# Patient Record
Sex: Female | Born: 1985 | Race: Asian | Hispanic: No | Smoking: Current some day smoker
Health system: Southern US, Community
[De-identification: ages and names within clinical notes are randomized; demographics above are authoritative.]

## PROBLEM LIST (undated history)

## (undated) DIAGNOSIS — I10 Essential (primary) hypertension: Secondary | ICD-10-CM

## (undated) DIAGNOSIS — F250 Schizoaffective disorder, bipolar type: Secondary | ICD-10-CM

## (undated) DIAGNOSIS — F259 Schizoaffective disorder, unspecified: Secondary | ICD-10-CM

## (undated) HISTORY — PX: NO PAST SURGERIES: SHX2092

---

## 2008-03-28 ENCOUNTER — Emergency Department (HOSPITAL_COMMUNITY): Admission: EM | Admit: 2008-03-28 | Discharge: 2008-03-28 | Payer: Self-pay | Admitting: Family Medicine

## 2009-02-17 ENCOUNTER — Emergency Department (HOSPITAL_COMMUNITY): Admission: EM | Admit: 2009-02-17 | Discharge: 2009-02-17 | Payer: Self-pay | Admitting: Family Medicine

## 2011-09-14 LAB — POCT URINALYSIS DIP (DEVICE)
Glucose, UA: NEGATIVE
Hgb urine dipstick: NEGATIVE
Nitrite: NEGATIVE
Operator id: 247071
Protein, ur: NEGATIVE
Specific Gravity, Urine: 1.015
Urobilinogen, UA: 0.2

## 2011-09-14 LAB — POCT INFECTIOUS MONO SCREEN: Mono Screen: POSITIVE — AB

## 2011-09-14 LAB — POCT PREGNANCY, URINE: Preg Test, Ur: NEGATIVE

## 2016-06-04 LAB — HM HEPATITIS C SCREENING LAB: HM Hepatitis Screen: NEGATIVE

## 2016-06-04 LAB — HM PAP SMEAR: HM PAP: NEGATIVE

## 2016-06-04 LAB — HM HIV SCREENING LAB: HM HIV SCREENING: NEGATIVE

## 2017-01-31 ENCOUNTER — Emergency Department
Admission: EM | Admit: 2017-01-31 | Discharge: 2017-01-31 | Disposition: A | Discharge status: Home / Self Care | Payer: No Typology Code available for payment source | Attending: Emergency Medicine | Admitting: Emergency Medicine

## 2017-01-31 ENCOUNTER — Emergency Department: Payer: No Typology Code available for payment source

## 2017-01-31 DIAGNOSIS — S199XXA Unspecified injury of neck, initial encounter: Secondary | ICD-10-CM | POA: Diagnosis present

## 2017-01-31 DIAGNOSIS — Y999 Unspecified external cause status: Secondary | ICD-10-CM | POA: Diagnosis not present

## 2017-01-31 DIAGNOSIS — M7918 Myalgia, other site: Secondary | ICD-10-CM

## 2017-01-31 DIAGNOSIS — Y92410 Unspecified street and highway as the place of occurrence of the external cause: Secondary | ICD-10-CM | POA: Insufficient documentation

## 2017-01-31 DIAGNOSIS — S161XXA Strain of muscle, fascia and tendon at neck level, initial encounter: Secondary | ICD-10-CM | POA: Insufficient documentation

## 2017-01-31 DIAGNOSIS — Y9389 Activity, other specified: Secondary | ICD-10-CM | POA: Diagnosis not present

## 2017-01-31 DIAGNOSIS — F17210 Nicotine dependence, cigarettes, uncomplicated: Secondary | ICD-10-CM | POA: Diagnosis not present

## 2017-01-31 DIAGNOSIS — I10 Essential (primary) hypertension: Secondary | ICD-10-CM | POA: Diagnosis not present

## 2017-01-31 HISTORY — DX: Essential (primary) hypertension: I10

## 2017-01-31 MED ORDER — CYCLOBENZAPRINE HCL 10 MG PO TABS: 10.0000 mg | ORAL_TABLET | Freq: Three times a day (TID) | ORAL | 0 refills | Status: DC | PRN

## 2017-01-31 MED ORDER — METHYLPREDNISOLONE 4 MG PO TBPK
ORAL_TABLET | ORAL | 0 refills | Status: DC
Start: 2017-01-31 — End: 2017-09-23

## 2017-01-31 MED ORDER — TRAMADOL HCL 50 MG PO TABS: 50.0000 mg | ORAL_TABLET | Freq: Four times a day (QID) | ORAL | 0 refills | Status: DC | PRN
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
## 2017-01-31 NOTE — Discharge Instructions (Signed)
advised combination of pain medication and muscle relaxers can cause drowsiness.
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
## 2017-08-18 ENCOUNTER — Encounter: Payer: Self-pay | Admitting: Emergency Medicine

## 2017-08-18 ENCOUNTER — Emergency Department
Admission: EM | Admit: 2017-08-18 | Discharge: 2017-08-18 | Disposition: A | Discharge status: Home / Self Care | Attending: Emergency Medicine | Admitting: Emergency Medicine

## 2017-08-18 DIAGNOSIS — R251 Tremor, unspecified: Secondary | ICD-10-CM | POA: Insufficient documentation

## 2017-08-18 DIAGNOSIS — R079 Chest pain, unspecified: Secondary | ICD-10-CM

## 2017-08-18 DIAGNOSIS — I10 Essential (primary) hypertension: Secondary | ICD-10-CM | POA: Insufficient documentation

## 2017-08-18 DIAGNOSIS — F419 Anxiety disorder, unspecified: Secondary | ICD-10-CM | POA: Insufficient documentation

## 2017-08-18 DIAGNOSIS — F410 Panic disorder [episodic paroxysmal anxiety] without agoraphobia: Secondary | ICD-10-CM

## 2017-08-18 DIAGNOSIS — F17210 Nicotine dependence, cigarettes, uncomplicated: Secondary | ICD-10-CM | POA: Insufficient documentation

## 2017-08-18 LAB — URINE DRUG SCREEN, QUALITATIVE (ARMC ONLY)
Amphetamines, Ur Screen: NOT DETECTED
BARBITURATES, UR SCREEN: NOT DETECTED
Benzodiazepine, Ur Scrn: NOT DETECTED
CANNABINOID 50 NG, UR ~~LOC~~: NOT DETECTED
Cocaine Metabolite,Ur ~~LOC~~: NOT DETECTED
MDMA (Ecstasy)Ur Screen: NOT DETECTED
Methadone Scn, Ur: NOT DETECTED
Opiate, Ur Screen: NOT DETECTED
Phencyclidine (PCP) Ur S: NOT DETECTED
TRICYCLIC, UR SCREEN: NOT DETECTED

## 2017-08-18 MED ORDER — LORAZEPAM 1 MG PO TABS
1.0000 mg | ORAL_TABLET | Freq: Once | ORAL | Status: AC
  Administered 2017-08-18: 1 mg via ORAL
  Filled 2017-08-18: qty 1
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
## 2017-08-18 NOTE — ED Notes (Signed)
PT VOLUNTARY PENDING PSYCH CONSULT. 

## 2017-08-18 NOTE — ED Notes (Signed)
Dr clapacs with pt now.  Pt calm and cooperative.

## 2017-08-18 NOTE — ED Provider Notes (Signed)
[facility] Emergency Department Provider Note  ____________________________________________  Time seen: Approximately 5:29 PM  I have reviewed the triage vital signs and the nursing notes.   HISTORY  Chief Complaint Dizziness    HPI Katherine Stephens is a 31 y.o. female with a history of hypertension, anxiety not currently on any medications, presenting with chest pain. The patient reports that over the past several days, she has had increased stress in her life. Today she was driving to fill out some paperwork which was stressing her out, and then she received a phone call, so she pulled over because she was developing a chest discomfort that felt like "my heart was filling up with air"and the sensation went upwards into her head. She then developed a shaky sensation and felt lightheaded. At this time, she is feeling slightly better but still uncomfortable and now she has a sensation of tightness in the left upper arm. She does occasionally smoke but denies any illicit drug use including cocaine use. She denies any SI, HI or hallucinations.   Past Medical History:  Diagnosis Date  . Hypertension     There are no active problems to display for this patient.   No past surgical history on file.  Current Outpatient Rx  . Order #: [id number] Class: Print  . Order #: [id number] Class: Print  . Order #: [id number] Class: Print    Allergies Amoxicillin; Penicillins; and Sudafed [pseudoephedrine hcl]  No family history on file.  Social History Social History  Substance Use Topics  . Smoking status: Current Some Day Smoker    Types: Cigarettes  . Smokeless tobacco: Never Used  . Alcohol use Yes     Comment: 2 x week    Review of Systems Constitutional: No fever/chills.Positive lightheadedness without syncope. Positive generalized shakiness. Eyes: No visual changes. ENT: No sore throat. No congestion or rhinorrhea. Cardiovascular: Positive chest pain. Denies  palpitations. Respiratory: Denies shortness of breath.  No cough. Gastrointestinal: No abdominal pain.  No nausea, no vomiting.  No diarrhea.  No constipation. Genitourinary: Negative for dysuria. Musculoskeletal: Negative for back pain. Skin: Negative for rash. Neurological: Negative for headaches. No focal numbness, tingling or weakness.  Psychiatric:Positive anxiety without SI, HI or hallucinations.    ____________________________________________   PHYSICAL EXAM:  VITAL SIGNS: ED Triage Vitals  Enc Vitals Group     BP 08/18/17 1701 (!) 162/103     Pulse Rate 08/18/17 1701 81     Resp 08/18/17 1701 18     Temp 08/18/17 1701 98.1 F (36.7 C)     Temp Source 08/18/17 1701 Oral     SpO2 08/18/17 1701 100 %     Weight 08/18/17 1702 145 lb (65.8 kg)     Height 08/18/17 1702 5\' 7"  (1.702 m)     Head Circumference --      Peak Flow --      Pain Score --      Pain Loc --      Pain Edu? --      Excl. in [place]? --     Constitutional: Alert and oriented. Answers questions appropriately. The patient is anxious on my examination but makes good eye contact and has good insight.  Eyes: Conjunctivae are normal.  EOMI. No scleral icterus. Head: Atraumatic. Nose: No congestion/rhinnorhea. Mouth/Throat: Mucous membranes are moist.  Neck: No stridor.  Supple.  No JVD. No meningismus. Cardiovascular: Normal rate, regular rhythm. No murmurs, rubs or gallops.  Respiratory: Normal respiratory effort.  No accessory muscle  use or retractions. Lungs CTAB.  No wheezes, rales or ronchi. Gastrointestinal: Soft, nontender and nondistended.  No guarding or rebound.  No peritoneal signs. Musculoskeletal: No LE edema. No ttp in the calves or palpable cords.  Negative Homan's sign. Neurologic:  A&Ox3.  Speech is clear.  Face and smile are symmetric.  EOMI.  Moves all extremities well. Skin:  Skin is warm, dry and intact. No rash noted. Psychiatric: The patient has an anxious mood and affect. She does  not have pressured speech. She denies SI, HI or hallucinations on my examination. ____________________________________________   LABS (all labs ordered are listed, but only abnormal results are displayed)  Labs Reviewed  URINE DRUG SCREEN, QUALITATIVE ([place])   ____________________________________________  EKG  ED ECG REPORT I, Eula Listen, the attending physician, personally viewed and interpreted this ECG.   Date: 08/18/2017  EKG Time: 1739  Rate: 69  Rhythm: normal sinus rhythm  Axis: normal  Intervals:none  ST&T Change: No STEMI; no evidence of Brugada syndrome, hypertrophy, or prolonged QT.  ____________________________________________  RADIOLOGY  No results found.  ____________________________________________   PROCEDURES  Procedure(s) performed: None  Procedures  Critical Care performed: No ____________________________________________   INITIAL IMPRESSION / ASSESSMENT AND PLAN / ED COURSE  Pertinent labs & imaging results that were available during my care of the patient were reviewed by me and considered in my medical decision making (see chart for details).  31 y.o. female with a history of anxiety not currently on medications, HTN, presenting with anxiety, chest pain and shakiness. Overall, the patient is hypertensive on arrival and we will recheck this. We'll do a screening EKG. It is likely that the patient's symptoms are most likely due to a panic attack in the setting of a stressful event. I have recommended that we run additional blood work and explained to the patient the differential, including checking blood counts, electrolytes, pregnancy, and a thyroid panel. At this time, she understands the workup that I am recommending but is deferring any studies other than an EKG. She demonstrates competence to make that decision. I have contacted psychiatry and TTS, and will treat the patient with Ativan to improve her  symptoms.  ----------------------------------------- 7:47 PM on 08/18/2017 -----------------------------------------  The patient's repeat blood pressure is 138/99, which is still slightly high for her age. I have encouraged her to follow up with her primary care physician for her hypertension. The patient has also been seen by Dr. Carlena Hurl, the psychiatrist on call, who agrees that she is safe for discharge home. Return precautions as well as follow-up instructions were discussed  ____________________________________________  FINAL CLINICAL IMPRESSION(S) / ED DIAGNOSES  Final diagnoses:  Essential hypertension  Anxiety  Chest pain, unspecified type         NEW MEDICATIONS STARTED DURING THIS VISIT:  New Prescriptions   No medications on file      Eula Listen, MD 08/18/17 1948

## 2017-08-18 NOTE — Consult Note (Signed)
[facility] Consult   Reason for Consult:  Consult for 31 year old woman who presented to the hospital with anxiety like symptoms Referring Physician:  Mariea Clonts Patient Identification: Katherine Stephens MRN:  [id number] Principal Diagnosis: Anxiety attack Diagnosis:   Patient Active Problem List   Diagnosis Date Noted  . Anxiety attack [F41.0] 08/18/2017    Total Time spent with patient: 1 hour  Subjective:   Katherine Stephens is a 31 y.o. female patient admitted with "it has been a stressful few days".  HPI:  Patient interviewed chart reviewed. This is a 31 year old woman who presented to the emergency room today saying that she had something like an anxiety attack. Her description of it is rather peculiar. She talks about feeling like there was air rushing into her heart. This happened all she was driving today. She had by her account just had a stressful phone conversation but she does not want to discuss the details of it. It lasted some short period of time and she came to the emergency room still feeling anxious and stressed. Additionally she says for the last few days she has been having an odd sense of smell. She says that she has smelled vinegar constantly. On top of that the last month has been stressful for her. She just recently moved to [place] from [place]. It sounds like she is going through a lot of transitions in her life. She denies being depressed. She denies suicidal thoughts. Denies any substance abuse. Not currently receiving any outpatient psychiatric treatment.  Social history: She is a little vague about this. Apparently she had been in graduate school recently and is currently not enrolled but is hoping to resume it sometime in the future. She says she is living by herself here in [place] where she grew up. She just moved back to the community recently and indicates that she is in the process of trying to buy a home.  Medical history: History of  hypertension no other ongoing known medical problems  Substance abuse history: Says that she drinks "on occasion" but was not drinking today and does not feel like it is ever been a problem. Absence abuse.    Past Psychiatric History: Patient admits that she has had psychiatric treatment in the past. She says that she last saw a psychiatrist many years ago. This was when she was living in [place]. She also admits that she has had 1 previous psychiatric hospitalization that occurred in [place] several years ago. She will not go into any specific details about the circumstances of it. Denies that she ever tried to kill her self. The only medicine she can ever recall being on his clonazepam.  Risk to Self: Suicidal Ideation: No Suicidal Intent: No Is patient at risk for suicide?: No Suicidal Plan?: No Access to Means: No What has been your use of drugs/alcohol within the last 12 months?: Pt denies drug use. Pt reports occasional alcohol consumption. Triggers for Past Attempts: None known Intentional Self Injurious Behavior: None Risk to Others: Homicidal Ideation: No Thoughts of Harm to Others: No Current Homicidal Intent: No Current Homicidal Plan: No Access to Homicidal Means: No History of harm to others?: No Assessment of Violence: None Noted Does patient have access to weapons?: No Criminal Charges Pending?: No Does patient have a court date: No Prior Inpatient Therapy: Prior Inpatient Therapy: No Prior Outpatient Therapy: Prior Outpatient Therapy: Yes Prior Therapy Dates: "In College" Prior Therapy Facilty/Provider(s): [facility] Counsel[facility] Reason for Treatment: Anxiety Does patient have an ACCT  team?: No Does patient have Intensive In-House Services?  : No Does patient have [facility] services? : No Does patient have P4CC services?: No  Past Medical History:  Past Medical History:  Diagnosis Date  . Hypertension    No past surgical history on file. Family History: No family  history on file. Family Psychiatric  History: Does not know of any Social History:  History  Alcohol Use  . Yes    Comment: 2 x week     History  Drug Use No    Social History   Social History  . Marital status: Divorced    Spouse name: N/A  . Number of children: N/A  . Years of education: N/A   Social History Main Topics  . Smoking status: Current Some Day Smoker    Types: Cigarettes  . Smokeless tobacco: Never Used  . Alcohol use Yes     Comment: 2 x week  . Drug use: No  . Sexual activity: Not Asked   Other Topics Concern  . None   Social History Narrative  . None   Additional Social History:    Allergies:   Allergies  Allergen Reactions  . Amoxicillin   . Penicillins   . Sudafed [Pseudoephedrine Hcl]     Labs:  Results for orders placed or performed during the hospital encounter of 08/18/17 (from the past 48 hour(s))  Urine Drug Screen, Qualitative ([place] only)     Status: None   Collection Time: 08/18/17  7:39 PM  Result Value Ref Range   Tricyclic, Ur Screen NONE DETECTED NONE DETECTED   Amphetamines, Ur Screen NONE DETECTED NONE DETECTED   MDMA (Ecstasy)Ur Screen NONE DETECTED NONE DETECTED   Cocaine Metabolite,Ur [place] NONE DETECTED NONE DETECTED   Opiate, Ur Screen NONE DETECTED NONE DETECTED   Phencyclidine (PCP) Ur S NONE DETECTED NONE DETECTED   Cannabinoid 50 Ng, Ur [place] NONE DETECTED NONE DETECTED   Barbiturates, Ur Screen NONE DETECTED NONE DETECTED   Benzodiazepine, Ur Scrn NONE DETECTED NONE DETECTED   Methadone Scn, Ur NONE DETECTED NONE DETECTED    Comment: (NOTE) 144  Tricyclics, urine               Cutoff 1000 ng/mL 200  Amphetamines, urine             Cutoff 1000 ng/mL 300  MDMA (Ecstasy), urine           Cutoff 500 ng/mL 400  Cocaine Metabolite, urine       Cutoff 300 ng/mL 500  Opiate, urine                   Cutoff 300 ng/mL 600  Phencyclidine (PCP), urine      Cutoff 25 ng/mL 700  Cannabinoid, urine              Cutoff 50  ng/mL 800  Barbiturates, urine             Cutoff 200 ng/mL 900  Benzodiazepine, urine           Cutoff 200 ng/mL 1000 Methadone, urine                Cutoff 300 ng/mL 1100 1200 The urine drug screen provides only a preliminary, unconfirmed 1300 analytical test result and should not be used for non-medical 1400 purposes. Clinical consideration and professional judgment should 1500 be applied to any positive drug screen result due to possible 1600 interfering substances. A more specific alternate chemical  method 1700 must be used in order to obtain a confirmed analytical result.  1800 Gas chromato graphy / mass spectrometry (GC/MS) is the preferred 1900 confirmatory method.     No current facility-administered medications for this encounter.    Current Outpatient Prescriptions  Medication Sig Dispense Refill  . cyclobenzaprine (FLEXERIL) 10 MG tablet Take 1 tablet (10 mg total) by mouth 3 (three) times daily as needed. 15 tablet 0  . methylPREDNISolone (MEDROL DOSEPAK) 4 MG TBPK tablet Take Tapered dose as directed 21 tablet 0  . traMADol (ULTRAM) 50 MG tablet Take 1 tablet (50 mg total) by mouth every 6 (six) hours as needed. 20 tablet 0    Musculoskeletal: Strength & Muscle Tone: within normal limits Gait & Station: normal Patient leans: N/A  Psychiatric Specialty Exam: Physical Exam  Nursing note and vitals reviewed. Constitutional: She appears well-developed and well-nourished.  HENT:  Head: Normocephalic and atraumatic.  Eyes: Pupils are equal, round, and reactive to light. Conjunctivae are normal.  Neck: Normal range of motion.  Cardiovascular: Regular rhythm and normal heart sounds.   Respiratory: Effort normal. No respiratory distress.  GI: Soft.  Musculoskeletal: Normal range of motion.  Neurological: She is alert.  Skin: Skin is warm and dry.  Psychiatric: Judgment normal. Her affect is blunt. Her speech is delayed. She is slowed. Thought content is not paranoid.  Cognition and memory are normal. She expresses no homicidal and no suicidal ideation.    Review of Systems  Constitutional: Negative.   HENT: Negative.   Eyes: Negative.   Respiratory: Negative.   Cardiovascular: Negative.   Gastrointestinal: Negative.   Musculoskeletal: Negative.   Skin: Negative.   Neurological: Positive for sensory change.  Psychiatric/Behavioral: Positive for hallucinations. Negative for depression, memory loss, substance abuse and suicidal ideas. The patient is not nervous/anxious and does not have insomnia.     Blood pressure (!) 138/99, pulse 81, temperature 98.1 F (36.7 C), temperature source Oral, resp. rate 18, height 5\' 7"  (1.702 m), weight 145 lb (65.8 kg), last menstrual period 08/04/2017, SpO2 100 %.Body mass index is 22.71 kg/m.  General Appearance: Casual  Eye Contact:  Fair  Speech:  Slow  Volume:  Decreased  Mood:  Anxious  Affect:  Blunt and Patient has a rather peculiar flat blank like affect and looks distracted much of the time  Thought Process:  Descriptions of Associations: Tangential  Orientation:  Full (Time, Place, and Person)  Thought Content:  Hallucinations: Olfactory and Patient's thought process is peculiar. She will often stray off topic. She uses odd language and odd metaphors to describe things. It is certainly possible to follow what she is talking about that at times she certainly sounds like she is getting a little disorganized or lost in her own thoughts.  Suicidal Thoughts:  No  Homicidal Thoughts:  No  Memory:  Immediate;   Good Recent;   Fair Remote;   Fair  Judgement:  Fair  Insight:  Fair  Psychomotor Activity:  Normal  Concentration:  Concentration: Fair  Recall:  [place] of Knowledge:  Poor  Language:  Fair  Akathisia:  No  Handed:  Right  AIMS (if indicated):     Assets:  Desire for Improvement Financial Resources/Insurance Housing Physical Health Resilience  ADL's:  Intact  Cognition:  WNL  Sleep:         Treatment Plan Summary: Plan 31 year old woman who came in with symptoms that sound like they are best described as an anxiety attack.  Her symptoms have resolved at this point. There is no sign of dangerousness. No need for hospitalization. Patient inquires about the possibility of being given a prescription of clonazepam. I told her that we were not able to start a controlled substance from the emergency room but strongly encouraged her to get follow-up treatment in the community either from her primary care doctor or we will give her information about local mental health resources. Case reviewed with emergency room physician and TTS. The ER doctor and I both agree that the patient certainly has something peculiar about her that might suggest a schizotypal sort of presentation but there is no evidence that she is frankly psychotic. No other medications prescribed.  Disposition: No evidence of imminent risk to self or others at present.   Patient does not meet criteria for psychiatric inpatient admission. Supportive therapy provided about ongoing stressors.  Alethia Berthold, MD 08/18/2017 9:05 PM

## 2017-08-18 NOTE — Discharge Instructions (Signed)
Please return to the emergency department if you develop severe pain, lightheadedness or fainting, thoughts of hurting herself or anyone else, panic attacks that are not controllable, or any other symptoms concerning to you.

## 2017-08-18 NOTE — BH Assessment (Signed)
Assessment Note  Katherine Stephens is an 31 y.o. female presenting voluntarily for assessment with c/o panic attack. Pt denies SI and h/o suicide attempt. Pt denies HI and thoughts of harm towards others. Pt denies h/o violence/aggression. Pt denies hallucinations however, mentions experiencing an "invisible nose bleed" pta and smelling blood and vinegar. Pt reports multiple ongoing stressors such as adjusting to recent relocation and multiple school related stressors.   Past Medical History:  Past Medical History:  Diagnosis Date  . Hypertension     No past surgical history on file.  Family History: No family history on file.  Social History:  reports that she has been smoking Cigarettes.  She has never used smokeless tobacco. She reports that she drinks alcohol. She reports that she does not use drugs.  Additional Social History:  Alcohol / Drug Use Pain Medications: Pt denies abuse. Prescriptions: Pt denies abuse. Over the Counter: Pt denies abuse. History of alcohol / drug use?: No history of alcohol / drug abuse  CIWA: CIWA-Ar BP: (!) 162/103 Pulse Rate: 81 COWS:    Allergies:  Allergies  Allergen Reactions  . Amoxicillin   . Penicillins   . Sudafed [Pseudoephedrine Hcl]     Home Medications:  (Not in a hospital admission)  OB/GYN Status:  Patient's last menstrual period was 08/04/2017.  General Assessment Data Location of Assessment: [facility] Inpatient ED TTS Assessment: In system Is this a Tele or Face-to-Face Assessment?: Face-to-Face Is this an Initial Assessment or a Re-assessment for this encounter?: Initial Assessment Marital status: Divorced [place] name: Coralyn Mark Is patient pregnant?: Unknown Pregnancy Status: Unknown Living Arrangements: Alone Can pt return to current living arrangement?: Yes Admission Status: Voluntary Is patient capable of signing voluntary admission?: Yes Referral Source: Self/Family/Friend Insurance type: [employer]  Arrangements: Alone Name of Psychiatrist: None Name of Therapist: None  Education Status Is patient currently in school?: Yes Highest grade of school patient has completed: College  Risk to self with the past 6 months Suicidal Ideation: No Has patient been a risk to self within the past 6 months prior to admission? : No Suicidal Intent: No Has patient had any suicidal intent within the past 6 months prior to admission? : No Is patient at risk for suicide?: No Suicidal Plan?: No Has patient had any suicidal plan within the past 6 months prior to admission? : No Access to Means: No What has been your use of drugs/alcohol within the last 12 months?: Pt denies drug use. Pt reports occasional alcohol consumption. Previous Attempts/Gestures: No Triggers for Past Attempts: None known Intentional Self Injurious Behavior: None Family Suicide History: No Recent stressful life event(s): Other (Comment) (school related stress, multiple adjustments -relocation) Persecutory voices/beliefs?: No Depression: No Substance abuse history and/or treatment for substance abuse?: No Suicide prevention information given to non-admitted patients: Yes  Risk to Others within the past 6 months Homicidal Ideation: No Does patient have any lifetime risk of violence toward others beyond the six months prior to admission? : No Thoughts of Harm to Others: No Current Homicidal Intent: No Current Homicidal Plan: No Access to Homicidal Means: No History of harm to others?: No Assessment of Violence: None Noted Does patient have access to weapons?: No Criminal Charges Pending?: No Does patient have a court date: No Is patient on probation?: No  Psychosis Hallucinations: Olfactory (blood & vinegar) Delusions: None noted  Mental Status Report Appearance/Hygiene: Unremarkable Eye Contact: Fair Motor Activity: Unremarkable Speech: Logical/coherent Level of Consciousness: Alert  Mood: Anxious Affect:  Anxious Anxiety Level: Moderate Thought Processes: Circumstantial Judgement: Unimpaired Orientation: Person, Place, Time, Situation Obsessive Compulsive Thoughts/Behaviors: None  Cognitive Functioning Concentration: Decreased Memory: Recent Intact, Remote Intact IQ: Average Insight: Fair Impulse Control: Good Appetite: Good Weight Loss: 0 Weight Gain: 10 Sleep: No Change Total Hours of Sleep: 6 Vegetative Symptoms: None  ADLScreening [facility] Assessment Services) Patient's cognitive ability adequate to safely complete daily activities?: Yes Patient able to express need for assistance with ADLs?: Yes Independently performs ADLs?: Yes (appropriate for developmental age)  Prior Inpatient Therapy Prior Inpatient Therapy: No  Prior Outpatient Therapy Prior Outpatient Therapy: Yes Prior Therapy Dates: "In College" Prior Therapy Facilty/Provider(s): School Counselor Reason for Treatment: Anxiety Does patient have an ACCT team?: No Does patient have Intensive In-House Services?  : No Does patient have [facility] services? : No Does patient have P4CC services?: No  ADL Screening (condition at time of admission) Patient's cognitive ability adequate to safely complete daily activities?: Yes Is the patient deaf or have difficulty hearing?: No Does the patient have difficulty seeing, even when wearing glasses/contacts?: No Does the patient have difficulty concentrating, remembering, or making decisions?: Yes Patient able to express need for assistance with ADLs?: Yes Does the patient have difficulty dressing or bathing?: No Independently performs ADLs?: Yes (appropriate for developmental age) Does the patient have difficulty walking or climbing stairs?: No Weakness of Legs: None Weakness of Arms/Hands: None  Home Assistive Devices/Equipment Home Assistive Devices/Equipment: None  Therapy Consults (therapy consults require a physician order) PT Evaluation Needed: No OT Evalulation  Needed: No SLP Evaluation Needed: No Abuse/Neglect Assessment (Assessment to be complete while patient is alone) Physical Abuse: Denies Verbal Abuse: Denies Sexual Abuse: Denies Exploitation of patient/patient's resources: Denies Self-Neglect: Denies Values / Beliefs Cultural Requests During Hospitalization: None Spiritual Requests During Hospitalization: None Consults Spiritual Care Consult Needed: No Advance Directives (For Healthcare) Does Patient Have a Medical Advance Directive?: No Would patient like information on creating a medical advance directive?: No - Patient declined    Additional Information 1:1 In Past 12 Months?: No CIRT Risk: No Elopement Risk: No Does patient have medical clearance?: No     Disposition:  Disposition Initial Assessment Completed for this Encounter: Yes Disposition of Patient: Other dispositions Other disposition(s): Other (Comment) (Psych MD Consult)  On Site Evaluation by:   Reviewed with Physician:    Demarco Bacci J [place] 08/18/2017 7:15 PM

## 2017-09-23 ENCOUNTER — Ambulatory Visit (INDEPENDENT_AMBULATORY_CARE_PROVIDER_SITE_OTHER): Payer: Self-pay | Admitting: Physician Assistant

## 2017-09-23 ENCOUNTER — Encounter: Payer: Self-pay | Admitting: Physician Assistant

## 2017-09-23 VITALS — BP 138/88 | HR 60 | Temp 98.5°F | Ht 66.0 in | Wt 160.0 lb

## 2017-09-23 DIAGNOSIS — Z720 Tobacco use: Secondary | ICD-10-CM

## 2017-09-23 DIAGNOSIS — K219 Gastro-esophageal reflux disease without esophagitis: Secondary | ICD-10-CM

## 2017-09-23 DIAGNOSIS — F411 Generalized anxiety disorder: Secondary | ICD-10-CM

## 2017-09-23 DIAGNOSIS — F17200 Nicotine dependence, unspecified, uncomplicated: Secondary | ICD-10-CM | POA: Insufficient documentation

## 2017-09-23 DIAGNOSIS — Z7689 Persons encountering health services in other specified circumstances: Secondary | ICD-10-CM

## 2017-09-23 DIAGNOSIS — Z2821 Immunization not carried out because of patient refusal: Secondary | ICD-10-CM

## 2017-09-23 MED ORDER — ESCITALOPRAM OXALATE 10 MG PO TABS: 10.0000 mg | ORAL_TABLET | Freq: Every day | ORAL | 2 refills | Status: DC

## 2017-09-23 NOTE — Patient Instructions (Addendum)
Health Maintenance, Female Adopting a healthy lifestyle and getting preventive care can go a long way to promote health and wellness. Talk with your health care provider about what schedule of regular examinations is right for you. This is a good chance for you to check in with your provider about disease prevention and staying healthy. In between checkups, there are plenty of things you can do on your own. Experts have done a lot of research about which lifestyle changes and preventive measures are most likely to keep you healthy. Ask your health care provider for more information. Weight and diet Eat a healthy diet  Be sure to include plenty of vegetables, fruits, low-fat dairy products, and lean protein.  Do not eat a lot of foods high in solid fats, added sugars, or salt.  Get regular exercise. This is one of the most important things you can do for your health. ? Most adults should exercise for at least 150 minutes each week. The exercise should increase your heart rate and make you sweat (moderate-intensity exercise). ? Most adults should also do strengthening exercises at least twice a week. This is in addition to the moderate-intensity exercise.  Maintain a healthy weight  Body mass index (BMI) is a measurement that can be used to identify possible weight problems. It estimates body fat based on height and weight. Your health care provider can help determine your BMI and help you achieve or maintain a healthy weight.  For females 20 years of age and older: ? A BMI below 18.5 is considered underweight. ? A BMI of 18.5 to 24.9 is normal. ? A BMI of 25 to 29.9 is considered overweight. ? A BMI of 30 and above is considered obese.  Watch levels of cholesterol and blood lipids  You should start having your blood tested for lipids and cholesterol at 32 years of age, then have this test every 5 years.  You may need to have your cholesterol levels checked more often if: ? Your lipid or  cholesterol levels are high. ? You are older than 32 years of age. ? You are at high risk for heart disease.  Cancer screening Lung Cancer  Lung cancer screening is recommended for adults 55-80 years old who are at high risk for lung cancer because of a history of smoking.  A yearly low-dose CT scan of the lungs is recommended for people who: ? Currently smoke. ? Have quit within the past 15 years. ? Have at least a 30-pack-year history of smoking. A pack year is smoking an average of one pack of cigarettes a day for 1 year.  Yearly screening should continue until it has been 15 years since you quit.  Yearly screening should stop if you develop a health problem that would prevent you from having lung cancer treatment.  Breast Cancer  Practice breast self-awareness. This means understanding how your breasts normally appear and feel.  It also means doing regular breast self-exams. Let your health care provider know about any changes, no matter how small.  If you are in your 20s or 30s, you should have a clinical breast exam (CBE) by a health care provider every 1-3 years as part of a regular health exam.  If you are 40 or older, have a CBE every year. Also consider having a breast X-ray (mammogram) every year.  If you have a family history of breast cancer, talk to your health care provider about genetic screening.  If you are at high risk   for breast cancer, talk to your health care provider about having an MRI and a mammogram every year.  Breast cancer gene (BRCA) assessment is recommended for women who have family members with BRCA-related cancers. BRCA-related cancers include: ? Breast. ? Ovarian. ? Tubal. ? Peritoneal cancers.  Results of the assessment will determine the need for genetic counseling and BRCA1 and BRCA2 testing.  Cervical Cancer Your health care provider may recommend that you be screened regularly for cancer of the pelvic organs (ovaries, uterus, and  vagina). This screening involves a pelvic examination, including checking for microscopic changes to the surface of your cervix (Pap test). You may be encouraged to have this screening done every 3 years, beginning at age 22.  For women ages 56-65, health care providers may recommend pelvic exams and Pap testing every 3 years, or they may recommend the Pap and pelvic exam, combined with testing for human papilloma virus (HPV), every 5 years. Some types of HPV increase your risk of cervical cancer. Testing for HPV may also be done on women of any age with unclear Pap test results.  Other health care providers may not recommend any screening for nonpregnant women who are considered low risk for pelvic cancer and who do not have symptoms. Ask your health care provider if a screening pelvic exam is right for you.  If you have had past treatment for cervical cancer or a condition that could lead to cancer, you need Pap tests and screening for cancer for at least 20 years after your treatment. If Pap tests have been discontinued, your risk factors (such as having a new sexual partner) need to be reassessed to determine if screening should resume. Some women have medical problems that increase the chance of getting cervical cancer. In these cases, your health care provider may recommend more frequent screening and Pap tests.  Colorectal Cancer  This type of cancer can be detected and often prevented.  Routine colorectal cancer screening usually begins at 32 years of age and continues through 32 years of age.  Your health care provider may recommend screening at an earlier age if you have risk factors for colon cancer.  Your health care provider may also recommend using home test kits to check for hidden blood in the stool.  A small camera at the end of a tube can be used to examine your colon directly (sigmoidoscopy or colonoscopy). This is done to check for the earliest forms of colorectal  cancer.  Routine screening usually begins at age 33.  Direct examination of the colon should be repeated every 5-10 years through 32 years of age. However, you may need to be screened more often if early forms of precancerous polyps or small growths are found.  Skin Cancer  Check your skin from head to toe regularly.  Tell your health care provider about any new moles or changes in moles, especially if there is a change in a mole's shape or color.  Also tell your health care provider if you have a mole that is larger than the size of a pencil eraser.  Always use sunscreen. Apply sunscreen liberally and repeatedly throughout the day.  Protect yourself by wearing long sleeves, pants, a wide-brimmed hat, and sunglasses whenever you are outside.  Heart disease, diabetes, and high blood pressure  High blood pressure causes heart disease and increases the risk of stroke. High blood pressure is more likely to develop in: ? People who have blood pressure in the high end of  the normal range (130-139/85-89 mm Hg). ? People who are overweight or obese. ? People who are African American.  If you are 18-39 years of age, have your blood pressure checked every 3-5 years. If you are 40 years of age or older, have your blood pressure checked every year. You should have your blood pressure measured twice--once when you are at a hospital or clinic, and once when you are not at a hospital or clinic. Record the average of the two measurements. To check your blood pressure when you are not at a hospital or clinic, you can use: ? An automated blood pressure machine at a pharmacy. ? A home blood pressure monitor.  If you are between 55 years and 79 years old, ask your health care provider if you should take aspirin to prevent strokes.  Have regular diabetes screenings. This involves taking a blood sample to check your fasting blood sugar level. ? If you are at a normal weight and have a low risk for  diabetes, have this test once every three years after 32 years of age. ? If you are overweight and have a high risk for diabetes, consider being tested at a younger age or more often. Preventing infection Hepatitis B  If you have a higher risk for hepatitis B, you should be screened for this virus. You are considered at high risk for hepatitis B if: ? You were born in a country where hepatitis B is common. Ask your health care provider which countries are considered high risk. ? Your parents were born in a high-risk country, and you have not been immunized against hepatitis B (hepatitis B vaccine). ? You have HIV or AIDS. ? You use needles to inject street drugs. ? You live with someone who has hepatitis B. ? You have had sex with someone who has hepatitis B. ? You get hemodialysis treatment. ? You take certain medicines for conditions, including cancer, organ transplantation, and autoimmune conditions.  Hepatitis C  Blood testing is recommended for: ? Everyone born from 1945 through 1965. ? Anyone with known risk factors for hepatitis C.  Sexually transmitted infections (STIs)  You should be screened for sexually transmitted infections (STIs) including gonorrhea and chlamydia if: ? You are sexually active and are younger than 32 years of age. ? You are older than 32 years of age and your health care provider tells you that you are at risk for this type of infection. ? Your sexual activity has changed since you were last screened and you are at an increased risk for chlamydia or gonorrhea. Ask your health care provider if you are at risk.  If you do not have HIV, but are at risk, it may be recommended that you take a prescription medicine daily to prevent HIV infection. This is called pre-exposure prophylaxis (PrEP). You are considered at risk if: ? You are sexually active and do not regularly use condoms or know the HIV status of your partner(s). ? You take drugs by injection. ? You  are sexually active with a partner who has HIV.  Talk with your health care provider about whether you are at high risk of being infected with HIV. If you choose to begin PrEP, you should first be tested for HIV. You should then be tested every 3 months for as long as you are taking PrEP. Pregnancy  If you are premenopausal and you may become pregnant, ask your health care provider about preconception counseling.  If you may become   pregnant, take 400 to 800 micrograms (mcg) of folic acid every day.  If you want to prevent pregnancy, talk to your health care provider about birth control (contraception). Osteoporosis and menopause  Osteoporosis is a disease in which the bones lose minerals and strength with aging. This can result in serious bone fractures. Your risk for osteoporosis can be identified using a bone density scan.  If you are 38 years of age or older, or if you are at risk for osteoporosis and fractures, ask your health care provider if you should be screened.  Ask your health care provider whether you should take a calcium or vitamin D supplement to lower your risk for osteoporosis.  Menopause may have certain physical symptoms and risks.  Hormone replacement therapy may reduce some of these symptoms and risks. Talk to your health care provider about whether hormone replacement therapy is right for you. Follow these instructions at home:  Schedule regular health, dental, and eye exams.  Stay current with your immunizations.  Do not use any tobacco products including cigarettes, chewing tobacco, or electronic cigarettes.  If you are pregnant, do not drink alcohol.  If you are breastfeeding, limit how much and how often you drink alcohol.  Limit alcohol intake to no more than 1 drink per day for nonpregnant women. One drink equals 12 ounces of beer, 5 ounces of wine, or 1 ounces of hard liquor.  Do not use street drugs.  Do not share needles.  Ask your health care  provider for help if you need support or information about quitting drugs.  Tell your health care provider if you often feel depressed.  Tell your health care provider if you have ever been abused or do not feel safe at home. This information is not intended to replace advice given to you by your health care provider. Make sure you discuss any questions you have with your health care provider. Document Released: 06/21/2011 Document Revised: 05/13/2016 Document Reviewed: 09/09/2015 Elsevier Interactive Patient Education  2018 [place] Maintenance, Female Adopting a healthy lifestyle and getting preventive care can go a long way to promote health and wellness. Talk with your health care provider about what schedule of regular examinations is right for you. This is a good chance for you to check in with your provider about disease prevention and staying healthy. In between checkups, there are plenty of things you can do on your own. Experts have done a lot of research about which lifestyle changes and preventive measures are most likely to keep you healthy. Ask your health care provider for more information. Weight and diet Eat a healthy diet  Be sure to include plenty of vegetables, fruits, low-fat dairy products, and lean protein.  Do not eat a lot of foods high in solid fats, added sugars, or salt.  Get regular exercise. This is one of the most important things you can do for your health. ? Most adults should exercise for at least 150 minutes each week. The exercise should increase your heart rate and make you sweat (moderate-intensity exercise). ? Most adults should also do strengthening exercises at least twice a week. This is in addition to the moderate-intensity exercise.  Maintain a healthy weight  Body mass index (BMI) is a measurement that can be used to identify possible weight problems. It estimates body fat based on height and weight. Your health care provider can help  determine your BMI and help you achieve or maintain a healthy weight.  For  females 62 years of age and older: ? A BMI below 18.5 is considered underweight. ? A BMI of 18.5 to 24.9 is normal. ? A BMI of 25 to 29.9 is considered overweight. ? A BMI of 30 and above is considered obese.  Watch levels of cholesterol and blood lipids  You should start having your blood tested for lipids and cholesterol at 32 years of age, then have this test every 5 years.  You may need to have your cholesterol levels checked more often if: ? Your lipid or cholesterol levels are high. ? You are older than 32 years of age. ? You are at high risk for heart disease.  Cancer screening Lung Cancer  Lung cancer screening is recommended for adults 43-33 years old who are at high risk for lung cancer because of a history of smoking.  A yearly low-dose CT scan of the lungs is recommended for people who: ? Currently smoke. ? Have quit within the past 15 years. ? Have at least a 30-pack-year history of smoking. A pack year is smoking an average of one pack of cigarettes a day for 1 year.  Yearly screening should continue until it has been 15 years since you quit.  Yearly screening should stop if you develop a health problem that would prevent you from having lung cancer treatment.  Breast Cancer  Practice breast self-awareness. This means understanding how your breasts normally appear and feel.  It also means doing regular breast self-exams. Let your health care provider know about any changes, no matter how small.  If you are in your 20s or 30s, you should have a clinical breast exam (CBE) by a health care provider every 1-3 years as part of a regular health exam.  If you are 70 or older, have a CBE every year. Also consider having a breast X-ray (mammogram) every year.  If you have a family history of breast cancer, talk to your health care provider about genetic screening.  If you are at high risk for  breast cancer, talk to your health care provider about having an MRI and a mammogram every year.  Breast cancer gene (BRCA) assessment is recommended for women who have family members with BRCA-related cancers. BRCA-related cancers include: ? Breast. ? Ovarian. ? Tubal. ? Peritoneal cancers.  Results of the assessment will determine the need for genetic counseling and BRCA1 and BRCA2 testing.  Cervical Cancer Your health care provider may recommend that you be screened regularly for cancer of the pelvic organs (ovaries, uterus, and vagina). This screening involves a pelvic examination, including checking for microscopic changes to the surface of your cervix (Pap test). You may be encouraged to have this screening done every 3 years, beginning at age 21.  For women ages 42-65, health care providers may recommend pelvic exams and Pap testing every 3 years, or they may recommend the Pap and pelvic exam, combined with testing for human papilloma virus (HPV), every 5 years. Some types of HPV increase your risk of cervical cancer. Testing for HPV may also be done on women of any age with unclear Pap test results.  Other health care providers may not recommend any screening for nonpregnant women who are considered low risk for pelvic cancer and who do not have symptoms. Ask your health care provider if a screening pelvic exam is right for you.  If you have had past treatment for cervical cancer or a condition that could lead to cancer, you need Pap tests and  screening for cancer for at least 20 years after your treatment. If Pap tests have been discontinued, your risk factors (such as having a new sexual partner) need to be reassessed to determine if screening should resume. Some women have medical problems that increase the chance of getting cervical cancer. In these cases, your health care provider may recommend more frequent screening and Pap tests.  Colorectal Cancer  This type of cancer can be  detected and often prevented.  Routine colorectal cancer screening usually begins at 32 years of age and continues through 32 years of age.  Your health care provider may recommend screening at an earlier age if you have risk factors for colon cancer.  Your health care provider may also recommend using home test kits to check for hidden blood in the stool.  A small camera at the end of a tube can be used to examine your colon directly (sigmoidoscopy or colonoscopy). This is done to check for the earliest forms of colorectal cancer.  Routine screening usually begins at age 25.  Direct examination of the colon should be repeated every 5-10 years through 32 years of age. However, you may need to be screened more often if early forms of precancerous polyps or small growths are found.  Skin Cancer  Check your skin from head to toe regularly.  Tell your health care provider about any new moles or changes in moles, especially if there is a change in a mole's shape or color.  Also tell your health care provider if you have a mole that is larger than the size of a pencil eraser.  Always use sunscreen. Apply sunscreen liberally and repeatedly throughout the day.  Protect yourself by wearing long sleeves, pants, a wide-brimmed hat, and sunglasses whenever you are outside.  Heart disease, diabetes, and high blood pressure  High blood pressure causes heart disease and increases the risk of stroke. High blood pressure is more likely to develop in: ? People who have blood pressure in the high end of the normal range (130-139/85-89 mm Hg). ? People who are overweight or obese. ? People who are African American.  If you are 72-40 years of age, have your blood pressure checked every 3-5 years. If you are 38 years of age or older, have your blood pressure checked every year. You should have your blood pressure measured twice--once when you are at a hospital or clinic, and once when you are not at a  hospital or clinic. Record the average of the two measurements. To check your blood pressure when you are not at a hospital or clinic, you can use: ? An automated blood pressure machine at a pharmacy. ? A home blood pressure monitor.  If you are between 48 years and 25 years old, ask your health care provider if you should take aspirin to prevent strokes.  Have regular diabetes screenings. This involves taking a blood sample to check your fasting blood sugar level. ? If you are at a normal weight and have a low risk for diabetes, have this test once every three years after 32 years of age. ? If you are overweight and have a high risk for diabetes, consider being tested at a younger age or more often. Preventing infection Hepatitis B  If you have a higher risk for hepatitis B, you should be screened for this virus. You are considered at high risk for hepatitis B if: ? You were born in a country where hepatitis B is common. Ask  your health care provider which countries are considered high risk. ? Your parents were born in a high-risk country, and you have not been immunized against hepatitis B (hepatitis B vaccine). ? You have HIV or AIDS. ? You use needles to inject street drugs. ? You live with someone who has hepatitis B. ? You have had sex with someone who has hepatitis B. ? You get hemodialysis treatment. ? You take certain medicines for conditions, including cancer, organ transplantation, and autoimmune conditions.  Hepatitis C  Blood testing is recommended for: ? Everyone born from [birth year] through 1966. ? Anyone with known risk factors for hepatitis C.  Sexually transmitted infections (STIs)  You should be screened for sexually transmitted infections (STIs) including gonorrhea and chlamydia if: ? You are sexually active and are younger than 32 years of age. ? You are older than 32 years of age and your health care provider tells you that you are at risk for this type of  infection. ? Your sexual activity has changed since you were last screened and you are at an increased risk for chlamydia or gonorrhea. Ask your health care provider if you are at risk.  If you do not have HIV, but are at risk, it may be recommended that you take a prescription medicine daily to prevent HIV infection. This is called pre-exposure prophylaxis (PrEP). You are considered at risk if: ? You are sexually active and do not regularly use condoms or know the HIV status of your partner(s). ? You take drugs by injection. ? You are sexually active with a partner who has HIV.  Talk with your health care provider about whether you are at high risk of being infected with HIV. If you choose to begin PrEP, you should first be tested for HIV. You should then be tested every 3 months for as long as you are taking PrEP. Pregnancy  If you are premenopausal and you may become pregnant, ask your health care provider about preconception counseling.  If you may become pregnant, take 400 to 800 micrograms (mcg) of folic acid every day.  If you want to prevent pregnancy, talk to your health care provider about birth control (contraception). Osteoporosis and menopause  Osteoporosis is a disease in which the bones lose minerals and strength with aging. This can result in serious bone fractures. Your risk for osteoporosis can be identified using a bone density scan.  If you are 38 years of age or older, or if you are at risk for osteoporosis and fractures, ask your health care provider if you should be screened.  Ask your health care provider whether you should take a calcium or vitamin D supplement to lower your risk for osteoporosis.  Menopause may have certain physical symptoms and risks.  Hormone replacement therapy may reduce some of these symptoms and risks. Talk to your health care provider about whether hormone replacement therapy is right for you. Follow these instructions at home:  Schedule  regular health, dental, and eye exams.  Stay current with your immunizations.  Do not use any tobacco products including cigarettes, chewing tobacco, or electronic cigarettes.  If you are pregnant, do not drink alcohol.  If you are breastfeeding, limit how much and how often you drink alcohol.  Limit alcohol intake to no more than 1 drink per day for nonpregnant women. One drink equals 12 ounces of beer, 5 ounces of wine, or 1 ounces of hard liquor.  Do not use street drugs.  Do not share  needles.  Ask your health care provider for help if you need support or information about quitting drugs.  Tell your health care provider if you often feel depressed.  Tell your health care provider if you have ever been abused or do not feel safe at home. This information is not intended to replace advice given to you by your health care provider. Make sure you discuss any questions you have with your health care provider. Document Released: 06/21/2011 Document Revised: 05/13/2016 Document Reviewed: 09/09/2015 Elsevier Interactive Patient Education  2018 [place] Maintenance, Female Adopting a healthy lifestyle and getting preventive care can go a long way to promote health and wellness. Talk with your health care provider about what schedule of regular examinations is right for you. This is a good chance for you to check in with your provider about disease prevention and staying healthy. In between checkups, there are plenty of things you can do on your own. Experts have done a lot of research about which lifestyle changes and preventive measures are most likely to keep you healthy. Ask your health care provider for more information. Weight and diet Eat a healthy diet  Be sure to include plenty of vegetables, fruits, low-fat dairy products, and lean protein.  Do not eat a lot of foods high in solid fats, added sugars, or salt.  Get regular exercise. This is one of the most important  things you can do for your health. ? Most adults should exercise for at least 150 minutes each week. The exercise should increase your heart rate and make you sweat (moderate-intensity exercise). ? Most adults should also do strengthening exercises at least twice a week. This is in addition to the moderate-intensity exercise.  Maintain a healthy weight  Body mass index (BMI) is a measurement that can be used to identify possible weight problems. It estimates body fat based on height and weight. Your health care provider can help determine your BMI and help you achieve or maintain a healthy weight.  For females 32 years of age and older: ? A BMI below 18.5 is considered underweight. ? A BMI of 18.5 to 24.9 is normal. ? A BMI of 25 to 29.9 is considered overweight. ? A BMI of 30 and above is considered obese.  Watch levels of cholesterol and blood lipids  You should start having your blood tested for lipids and cholesterol at 32 years of age, then have this test every 5 years.  You may need to have your cholesterol levels checked more often if: ? Your lipid or cholesterol levels are high. ? You are older than 32 years of age. ? You are at high risk for heart disease.  Cancer screening Lung Cancer  Lung cancer screening is recommended for adults 23-78 years old who are at high risk for lung cancer because of a history of smoking.  A yearly low-dose CT scan of the lungs is recommended for people who: ? Currently smoke. ? Have quit within the past 15 years. ? Have at least a 30-pack-year history of smoking. A pack year is smoking an average of one pack of cigarettes a day for 1 year.  Yearly screening should continue until it has been 15 years since you quit.  Yearly screening should stop if you develop a health problem that would prevent you from having lung cancer treatment.  Breast Cancer  Practice breast self-awareness. This means understanding how your breasts normally appear  and feel.  It also means doing  regular breast self-exams. Let your health care provider know about any changes, no matter how small.  If you are in your 20s or 30s, you should have a clinical breast exam (CBE) by a health care provider every 1-3 years as part of a regular health exam.  If you are 35 or older, have a CBE every year. Also consider having a breast X-ray (mammogram) every year.  If you have a family history of breast cancer, talk to your health care provider about genetic screening.  If you are at high risk for breast cancer, talk to your health care provider about having an MRI and a mammogram every year.  Breast cancer gene (BRCA) assessment is recommended for women who have family members with BRCA-related cancers. BRCA-related cancers include: ? Breast. ? Ovarian. ? Tubal. ? Peritoneal cancers.  Results of the assessment will determine the need for genetic counseling and BRCA1 and BRCA2 testing.  Cervical Cancer Your health care provider may recommend that you be screened regularly for cancer of the pelvic organs (ovaries, uterus, and vagina). This screening involves a pelvic examination, including checking for microscopic changes to the surface of your cervix (Pap test). You may be encouraged to have this screening done every 3 years, beginning at age 20.  For women ages 37-65, health care providers may recommend pelvic exams and Pap testing every 3 years, or they may recommend the Pap and pelvic exam, combined with testing for human papilloma virus (HPV), every 5 years. Some types of HPV increase your risk of cervical cancer. Testing for HPV may also be done on women of any age with unclear Pap test results.  Other health care providers may not recommend any screening for nonpregnant women who are considered low risk for pelvic cancer and who do not have symptoms. Ask your health care provider if a screening pelvic exam is right for you.  If you have had past treatment  for cervical cancer or a condition that could lead to cancer, you need Pap tests and screening for cancer for at least 20 years after your treatment. If Pap tests have been discontinued, your risk factors (such as having a new sexual partner) need to be reassessed to determine if screening should resume. Some women have medical problems that increase the chance of getting cervical cancer. In these cases, your health care provider may recommend more frequent screening and Pap tests.  Colorectal Cancer  This type of cancer can be detected and often prevented.  Routine colorectal cancer screening usually begins at 32 years of age and continues through 32 years of age.  Your health care provider may recommend screening at an earlier age if you have risk factors for colon cancer.  Your health care provider may also recommend using home test kits to check for hidden blood in the stool.  A small camera at the end of a tube can be used to examine your colon directly (sigmoidoscopy or colonoscopy). This is done to check for the earliest forms of colorectal cancer.  Routine screening usually begins at age 16.  Direct examination of the colon should be repeated every 5-10 years through 32 years of age. However, you may need to be screened more often if early forms of precancerous polyps or small growths are found.  Skin Cancer  Check your skin from head to toe regularly.  Tell your health care provider about any new moles or changes in moles, especially if there is a change in a mole's shape  or color.  Also tell your health care provider if you have a mole that is larger than the size of a pencil eraser.  Always use sunscreen. Apply sunscreen liberally and repeatedly throughout the day.  Protect yourself by wearing long sleeves, pants, a wide-brimmed hat, and sunglasses whenever you are outside.  Heart disease, diabetes, and high blood pressure  High blood pressure causes heart disease and  increases the risk of stroke. High blood pressure is more likely to develop in: ? People who have blood pressure in the high end of the normal range (130-139/85-89 mm Hg). ? People who are overweight or obese. ? People who are African American.  If you are 38-62 years of age, have your blood pressure checked every 3-5 years. If you are 12 years of age or older, have your blood pressure checked every year. You should have your blood pressure measured twice--once when you are at a hospital or clinic, and once when you are not at a hospital or clinic. Record the average of the two measurements. To check your blood pressure when you are not at a hospital or clinic, you can use: ? An automated blood pressure machine at a pharmacy. ? A home blood pressure monitor.  If you are between 59 years and 66 years old, ask your health care provider if you should take aspirin to prevent strokes.  Have regular diabetes screenings. This involves taking a blood sample to check your fasting blood sugar level. ? If you are at a normal weight and have a low risk for diabetes, have this test once every three years after 32 years of age. ? If you are overweight and have a high risk for diabetes, consider being tested at a younger age or more often. Preventing infection Hepatitis B  If you have a higher risk for hepatitis B, you should be screened for this virus. You are considered at high risk for hepatitis B if: ? You were born in a country where hepatitis B is common. Ask your health care provider which countries are considered high risk. ? Your parents were born in a high-risk country, and you have not been immunized against hepatitis B (hepatitis B vaccine). ? You have HIV or AIDS. ? You use needles to inject street drugs. ? You live with someone who has hepatitis B. ? You have had sex with someone who has hepatitis B. ? You get hemodialysis treatment. ? You take certain medicines for conditions, including  cancer, organ transplantation, and autoimmune conditions.  Hepatitis C  Blood testing is recommended for: ? Everyone born from [birth year] through 1966. ? Anyone with known risk factors for hepatitis C.  Sexually transmitted infections (STIs)  You should be screened for sexually transmitted infections (STIs) including gonorrhea and chlamydia if: ? You are sexually active and are younger than 32 years of age. ? You are older than 32 years of age and your health care provider tells you that you are at risk for this type of infection. ? Your sexual activity has changed since you were last screened and you are at an increased risk for chlamydia or gonorrhea. Ask your health care provider if you are at risk.  If you do not have HIV, but are at risk, it may be recommended that you take a prescription medicine daily to prevent HIV infection. This is called pre-exposure prophylaxis (PrEP). You are considered at risk if: ? You are sexually active and do not regularly use condoms or know the  HIV status of your partner(s). ? You take drugs by injection. ? You are sexually active with a partner who has HIV.  Talk with your health care provider about whether you are at high risk of being infected with HIV. If you choose to begin PrEP, you should first be tested for HIV. You should then be tested every 3 months for as long as you are taking PrEP. Pregnancy  If you are premenopausal and you may become pregnant, ask your health care provider about preconception counseling.  If you may become pregnant, take 400 to 800 micrograms (mcg) of folic acid every day.  If you want to prevent pregnancy, talk to your health care provider about birth control (contraception). Osteoporosis and menopause  Osteoporosis is a disease in which the bones lose minerals and strength with aging. This can result in serious bone fractures. Your risk for osteoporosis can be identified using a bone density scan.  If you are 76 years  of age or older, or if you are at risk for osteoporosis and fractures, ask your health care provider if you should be screened.  Ask your health care provider whether you should take a calcium or vitamin D supplement to lower your risk for osteoporosis.  Menopause may have certain physical symptoms and risks.  Hormone replacement therapy may reduce some of these symptoms and risks. Talk to your health care provider about whether hormone replacement therapy is right for you. Follow these instructions at home:  Schedule regular health, dental, and eye exams.  Stay current with your immunizations.  Do not use any tobacco products including cigarettes, chewing tobacco, or electronic cigarettes.  If you are pregnant, do not drink alcohol.  If you are breastfeeding, limit how much and how often you drink alcohol.  Limit alcohol intake to no more than 1 drink per day for nonpregnant women. One drink equals 12 ounces of beer, 5 ounces of wine, or 1 ounces of hard liquor.  Do not use street drugs.  Do not share needles.  Ask your health care provider for help if you need support or information about quitting drugs.  Tell your health care provider if you often feel depressed.  Tell your health care provider if you have ever been abused or do not feel safe at home. This information is not intended to replace advice given to you by your health care provider. Make sure you discuss any questions you have with your health care provider. Document Released: 06/21/2011 Document Revised: 05/13/2016 Document Reviewed: 09/09/2015 Elsevier Interactive Patient Education  [employer].

## 2017-09-23 NOTE — Progress Notes (Signed)
Patient: Katherine Stephens Female    DOB: [birth year]   31 y.o.   MRN: [id number] Visit Date: 09/23/2017  Today's Provider: Trinna Post, PA-C   Chief Complaint  Patient presents with  . Establish Care  . Hypertension  . Anxiety   Subjective:    Katherine Stephens is a 31 y/o woman presenting today to establish care. She was born in [place] but has recently moved back here from [facility], [place]. 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  She is in a relationship, though not sexually active. Has a 23 year old son. She reports she had a PAP smear 1.5 years ago with her previous provider in [place]. Reports her vaccinations are there as well. Declines flu vaccine today.   No family history of colon or breast cancer. Mom and dad both doing well. She has no siblings.  Family history colon cancer, breast cancer Mom and dad: doing well   She smokes cigarettes. Does not reveal how much. Says she uses them to deal with anxiety.  She mentions a history of anxiety. Was on medication for 1.5 years in college, cannot remember the name. Was recently in the ER in 08/2017 for panic attack. She was given Ativan with resolution and instructions to follow up with PCP. Says she has occasional fits of anxiety and panicked feeling. Not talking to therapist. Desires medication.  Also desires restarting birth control.   She also says she has a pain in her left side that feels like "stomach acid is pulling up." Eats large amounts of spicy foods on a daily basis, which she says has never bothered her before.   Hypertension  This is a chronic problem. The problem is uncontrolled (Last check 138/99 at the ER). Associated symptoms include anxiety. Pertinent negatives include no blurred vision, chest pain, headaches, malaise/fatigue, neck pain, orthopnea, palpitations, peripheral edema, PND, shortness of breath or sweats.  Anxiety  Presents for initial visit. Symptoms include excessive worry (Comes  and Goes), nervous/anxious behavior and panic (Once or twice a month). Patient reports no chest pain, palpitations, shortness of breath or suicidal ideas.   Treatments tried: Pt reports she is not sure what she took while she was in college.       Allergies  Allergen Reactions  . Amoxicillin   . Penicillins   . Sudafed [Pseudoephedrine Hcl]     No current outpatient prescriptions on file.  Review of Systems  Constitutional: Negative for malaise/fatigue.  Eyes: Negative for blurred vision.  Respiratory: Negative for shortness of breath.   Cardiovascular: Negative for chest pain, palpitations, orthopnea and PND.  Musculoskeletal: Negative for neck pain.  Neurological: Negative for headaches.  Psychiatric/Behavioral: Negative for suicidal ideas. The patient is nervous/anxious.     Social History  Substance Use Topics  . Smoking status: Current Some Day Smoker    Types: Cigarettes  . Smokeless tobacco: Never Used  . Alcohol use Yes     Comment: 2 x week   Objective:   BP 138/88 (BP Location: Right Arm, Patient Position: Sitting, Cuff Size: Normal)   Pulse 60   Temp 98.5 F (36.9 C) (Oral)   Ht 5\' 6"  (1.676 m)   Wt 160 lb (72.6 kg)   LMP 09/02/2017   BMI 25.82 kg/m  Vitals:   09/23/17 1032  BP: 138/88  Pulse: 60  Temp: 98.5 F (36.9 C)  TempSrc: Oral  Weight: 160 lb (72.6 kg)  Height: 5\' 6"  (1.676 m)  Physical Exam  Constitutional: She is oriented to person, place, and time. She appears well-developed and well-nourished.  HENT:  Right Ear: External ear normal.  Left Ear: External ear normal.  Mouth/Throat: Oropharynx is clear and moist.  Eyes: Conjunctivae are normal.  Neck: Neck supple.  Cardiovascular: Normal rate and regular rhythm.   Pulmonary/Chest: Effort normal and breath sounds normal.  Abdominal: Soft. Bowel sounds are normal.  Lymphadenopathy:    She has no cervical adenopathy.  Neurological: She is alert and oriented to person, place, and  time.  Skin: Skin is warm and dry.  Psychiatric: She has a normal mood and affect. Her behavior is normal.        Assessment & Plan:     1. Encounter to establish care  Need records from prior PCP, including vaccinations and PAP.  2. Generalized anxiety disorder  Recheck 2 mo at physical.  - escitalopram (LEXAPRO) 10 MG tablet; Take 1 tablet (10 mg total) by mouth daily.  Dispense: 30 tablet; Refill: 2  3. Influenza vaccination declined   4. Gastroesophageal reflux disease, esophagitis presence not specified  Stop spicy foods.  5. Tobacco use  Return in about 2 months (around 11/23/2017) for physical exam, possible PAP.  The entirety of the information documented in the History of Present Illness, Review of Systems and Physical Exam were personally obtained by me. Portions of this information were initially documented by Katherine Stephens, CMA and reviewed by me for thoroughness and accuracy.          Trinna Post, PA-C  [facility]

## 2017-09-29 ENCOUNTER — Encounter: Payer: Self-pay | Admitting: Physician Assistant

## 2017-09-29 ENCOUNTER — Ambulatory Visit (INDEPENDENT_AMBULATORY_CARE_PROVIDER_SITE_OTHER): Payer: Self-pay | Admitting: Physician Assistant

## 2017-09-29 VITALS — BP 130/82 | HR 92 | Temp 98.0°F | Resp 16 | Wt 159.0 lb

## 2017-09-29 DIAGNOSIS — Z3009 Encounter for other general counseling and advice on contraception: Secondary | ICD-10-CM

## 2017-09-29 DIAGNOSIS — D229 Melanocytic nevi, unspecified: Secondary | ICD-10-CM

## 2017-09-29 DIAGNOSIS — I10 Essential (primary) hypertension: Secondary | ICD-10-CM

## 2017-09-29 NOTE — Patient Instructions (Signed)
Mole A mole is a colored (pigmented) growth on the skin. Moles are very common. They are usually harmless, but some moles can become cancerous over time. What are the causes? Moles occur when pigmented skin cells grow together in clusters instead of spreading out in the skin as they normally do. The reason why the skin cells grow together in clusters is not known. What are the signs or symptoms? A mole may be:  Brown or black.  Flat or raised.  Smooth or wrinkled.  How is this diagnosed? A mole is diagnosed with a skin exam. If your health care provider thinks a mole may be cancerous, a piece of the mole will be removed for testing. How is this treated? Treatment is not needed unless a mole is cancerous. If a mole is cancerous, it will be removed. If a mole is causing pain or you do not like the way it looks, you may choose to have it removed. Follow these instructions at home:  Every month, look for new moles and check your existing moles for changes. This is important because a change in a mole can mean that the mole has become cancerous. Look for changes in: ? Size. Look for moles that are more than  in (0.64 cm) wide (in diameter). ? Shape. Look for moles that are not round or oval. ? Borders. Look for moles that are not symmetrical. ? Color. Note that it is normal for moles to get darker during pregnancy or when you take birth control pills.  When you are outdoors, wear sunscreen with SPF 30 (sun protection factor 30) or higher. Reapply the sunscreen every 2-3 hours.  If you have a large number of moles, see a skin doctor (dermatologist) at least one time every year. Contact a health care provider if:  The size, shape, borders, or color of your mole change.  Your mole, or the skin near the mole, becomes painful, sore, red, or swollen.  Your mole: ? Develops more than one color. ? Itches or bleeds. ? Becomes scaly, sheds skin, or oozes fluid. ? Becomes flat or develops  raised areas. ? Becomes hard or soft.  You develop a new mole. This information is not intended to replace advice given to you by your health care provider. Make sure you discuss any questions you have with your health care provider. Document Released: 08/31/2001 Document Revised: 05/19/2016 Document Reviewed: 09/26/2015 Elsevier Interactive Patient Education  2018 [employer] Inc[employer]  

## 2017-09-29 NOTE — Progress Notes (Addendum)
Patient: Katherine Stephens Female    DOB: 10/06/1986   31 y.o.   MRN: [id number] Visit Date: 09/29/2017  Today's Provider: Trinna Post, PA-C   Chief Complaint  Patient presents with  . Anxiety    follow up   . Skin Problem   Subjective:    HPI   Patient arrived 15 minutes late for her Complete Physical Exam. She was seen, however visit was changed to office visit to accommodate for time. She expresses understanding of our late and dismissal policy.   Follow up of Anxiety:  Patient was last seen for this problem 6 days ago and was started on Lexapro 10mg  daily. Patient reports poor compliance with treatment. She states she has not started taking Lexapro yet. She has tried eating healthier and exercising which she feels has helped with her anxiety.  Mole:  Patient comes in requesting a referral to Dermatology for evaluation of suspicious moles. The mole is locate near her left eyebrow. She has a family history of skin cancer. This is a new mole to her. Says she went to [facility] in [place] and they required a referral 2/2 her insurance.  Contraception Counseling  Patient requesting to restart her birth control patch. She currently has some patches left. She has a history of a copper IUD and she says something "was wrong with her uterine tract" and she had heavy bleeding and cramping, thus had it removed. She smokes two cigarettes per month. She had no history of migraine with aura. She has no history of blood clot or stroke. Her BP readings her last two office visits have been hypertensive. She had extremely elevated readings in the ER following stressful events, however even repeat BP was 138/99  BP Readings from Last 3 Encounters:  09/29/17 130/82  09/23/17 138/88  08/18/17 (!) 138/99       Allergies  Allergen Reactions  . Amoxicillin   . Penicillins   . Sudafed [Pseudoephedrine Hcl]      Current Outpatient Prescriptions:  .  escitalopram (LEXAPRO)  10 MG tablet, Take 1 tablet (10 mg total) by mouth daily. (Patient not taking: Reported on 09/29/2017), Disp: 30 tablet, Rfl: 2  Review of Systems  Constitutional: Negative for appetite change, chills, fatigue and fever.  Respiratory: Negative for chest tightness and shortness of breath.   Cardiovascular: Negative for chest pain and palpitations.  Gastrointestinal: Negative for abdominal pain, nausea and vomiting.  Skin:       Mole on face  Neurological: Negative for dizziness and weakness.  Psychiatric/Behavioral: The patient is nervous/anxious (improved).     Social History  Substance Use Topics  . Smoking status: Current Some Day Smoker    Types: Cigarettes  . Smokeless tobacco: Never Used     Comment: smokes cigarettes occasionally. Started smoking at age 72  . Alcohol use Yes     Comment: 2 x week   Objective:   BP 130/82 (BP Location: Left Arm, Patient Position: Sitting, Cuff Size: Normal)   Pulse 92   Temp 98 F (36.7 C) (Oral)   Resp 16   Wt 159 lb (72.1 kg)   LMP 09/02/2017   SpO2 98% Comment: room air  BMI 25.66 kg/m  Vitals:   09/29/17 1024  BP: 130/82  Pulse: 92  Resp: 16  Temp: 98 F (36.7 C)  TempSrc: Oral  SpO2: 98%  Weight: 159 lb (72.1 kg)     Physical Exam  Constitutional: She is oriented  to person, place, and time. She appears well-developed and well-nourished.  Cardiovascular: Normal rate.   Pulmonary/Chest: Effort normal.  Neurological: She is alert and oriented to person, place, and time.  Skin: Skin is warm and dry.     Psychiatric: She has a normal mood and affect. Her behavior is normal.        Assessment & Plan:     1. Suspicious nevus  Will refer to dermatology.  - Ambulatory referral to Dermatology  2. Hypertension, unspecified type  BP better today, but still in hypertensive range.  3. Encounter for counseling regarding contraception  Patient desires to refill patch. However, patient does smoke and has history of  hypertension as demonstrated by previous office and ER visit readings. Estrogen containing contraception contraindicated 2/2 HTN. Have counseled patient on this. Patient does not desire pill, IUD/Nexplanon, or Depo shot. She desires the patch only. I have reiterated my concerns and would be happy to provide a progesterone only contraceptive method should she desire.  Return in about 2 months (around 11/29/2017).  I have spent 15 minutes with this patient, >50% of which was spent on counseling and coordination of care.  The entirety of the information documented in the History of Present Illness, Review of Systems and Physical Exam were personally obtained by me. Portions of this information were initially documented by Linus Salmons, CMA and reviewed by me for thoroughness and accuracy.            Trinna Post, PA-C  [facility]

## 2017-10-06 ENCOUNTER — Telehealth: Payer: Self-pay | Admitting: Physician Assistant

## 2017-10-06 NOTE — Telephone Encounter (Addendum)
[facility] ([facility]) faxed [place] for records.  Received Pap Pathology ( 1 page ) from [place], sent to [facility].

## 2017-10-07 ENCOUNTER — Other Ambulatory Visit: Payer: Self-pay | Admitting: Physician Assistant

## 2017-10-07 DIAGNOSIS — R87810 Cervical high risk human papillomavirus (HPV) DNA test positive: Principal | ICD-10-CM

## 2017-10-07 DIAGNOSIS — R87610 Atypical squamous cells of undetermined significance on cytologic smear of cervix (ASC-US): Secondary | ICD-10-CM

## 2017-10-18 ENCOUNTER — Encounter: Payer: Self-pay | Admitting: *Deleted

## 2017-10-18 ENCOUNTER — Emergency Department
Admission: EM | Admit: 2017-10-18 | Discharge: 2017-10-18 | Disposition: A | Discharge status: Home / Self Care | Payer: Self-pay | Attending: Emergency Medicine | Admitting: Emergency Medicine

## 2017-10-18 ENCOUNTER — Emergency Department: Payer: Self-pay

## 2017-10-18 DIAGNOSIS — F2081 Schizophreniform disorder: Secondary | ICD-10-CM

## 2017-10-18 DIAGNOSIS — F489 Nonpsychotic mental disorder, unspecified: Secondary | ICD-10-CM

## 2017-10-18 DIAGNOSIS — R440 Auditory hallucinations: Secondary | ICD-10-CM

## 2017-10-18 DIAGNOSIS — F23 Brief psychotic disorder: Secondary | ICD-10-CM

## 2017-10-18 DIAGNOSIS — I10 Essential (primary) hypertension: Secondary | ICD-10-CM | POA: Insufficient documentation

## 2017-10-18 DIAGNOSIS — F17210 Nicotine dependence, cigarettes, uncomplicated: Secondary | ICD-10-CM | POA: Insufficient documentation

## 2017-10-18 LAB — COMPREHENSIVE METABOLIC PANEL
ALT: 14 U/L (ref 14–54)
AST: 21 U/L (ref 15–41)
Albumin: 4.6 g/dL (ref 3.5–5.0)
Alkaline Phosphatase: 36 U/L — ABNORMAL LOW (ref 38–126)
Anion gap: 9 (ref 5–15)
BILIRUBIN TOTAL: 0.5 mg/dL (ref 0.3–1.2)
BUN: 8 mg/dL (ref 6–20)
CHLORIDE: 104 mmol/L (ref 101–111)
CO2: 25 mmol/L (ref 22–32)
Calcium: 9.3 mg/dL (ref 8.9–10.3)
Creatinine, Ser: 0.73 mg/dL (ref 0.44–1.00)
Glucose, Bld: 108 mg/dL — ABNORMAL HIGH (ref 65–99)
POTASSIUM: 4.9 mmol/L (ref 3.5–5.1)
Sodium: 138 mmol/L (ref 135–145)
TOTAL PROTEIN: 7.8 g/dL (ref 6.5–8.1)

## 2017-10-18 LAB — CBC WITH DIFFERENTIAL/PLATELET
Basophils Absolute: 0.1 10*3/uL (ref 0–0.1)
Basophils Relative: 1 %
Eosinophils Absolute: 0.3 10*3/uL (ref 0–0.7)
Eosinophils Relative: 3 %
HCT: 44.3 % (ref 35.0–47.0)
Hemoglobin: 14.7 g/dL (ref 12.0–16.0)
LYMPHS ABS: 1.7 10*3/uL (ref 1.0–3.6)
LYMPHS PCT: 19 %
MCH: 28.8 pg (ref 26.0–34.0)
MCHC: 33.2 g/dL (ref 32.0–36.0)
MCV: 86.6 fL (ref 80.0–100.0)
MONO ABS: 0.7 10*3/uL (ref 0.2–0.9)
MONOS PCT: 8 %
Neutro Abs: 6.1 10*3/uL (ref 1.4–6.5)
Neutrophils Relative %: 69 %
Platelets: 264 10*3/uL (ref 150–440)
RBC: 5.12 MIL/uL (ref 3.80–5.20)
RDW: 12.6 % (ref 11.5–14.5)
WBC: 8.9 10*3/uL (ref 3.6–11.0)

## 2017-10-18 LAB — URINE DRUG SCREEN, QUALITATIVE (ARMC ONLY)
Amphetamines, Ur Screen: NOT DETECTED
BARBITURATES, UR SCREEN: NOT DETECTED
Benzodiazepine, Ur Scrn: NOT DETECTED
CANNABINOID 50 NG, UR ~~LOC~~: NOT DETECTED
COCAINE METABOLITE, UR ~~LOC~~: NOT DETECTED
MDMA (ECSTASY) UR SCREEN: NOT DETECTED
METHADONE SCREEN, URINE: NOT DETECTED
Opiate, Ur Screen: NOT DETECTED
Phencyclidine (PCP) Ur S: NOT DETECTED
TRICYCLIC, UR SCREEN: NOT DETECTED

## 2017-10-18 LAB — ETHANOL

## 2017-10-18 LAB — PREGNANCY, URINE: PREG TEST UR: NEGATIVE

## 2017-10-18 LAB — TSH: TSH: 0.915 u[IU]/mL (ref 0.350–4.500)

## 2017-10-18 MED ORDER — RISPERIDONE 1 MG PO TABS: 1.0000 mg | ORAL_TABLET | Freq: Two times a day (BID) | ORAL | 1 refills | Status: DC

## 2017-10-18 NOTE — Consult Note (Signed)
[place] Psychiatry Consult   Reason for Consult: Consult for 31 year old woman with history of previous psychiatric evaluation who comes in complaining of what are essentially auditory hallucinations Referring Physician: Jimmye Norman Patient Identification: Katherine Stephens MRN:  [id number] Principal Diagnosis: Schizophreniform disorder [facility]) Diagnosis:   Patient Active Problem List   Diagnosis Date Noted  . Schizophreniform disorder ([place]) [F20.81] 10/18/2017  . Auditory hallucinations [R44.0] 10/18/2017  . Poor insight into neurotic condition [F48.9] 10/18/2017  . ASCUS with positive high risk HPV cervical [R87.610, R87.810] 10/07/2017  . Hypertension [I10] 09/29/2017  . Tobacco use [Z72.0] 09/23/2017  . Anxiety attack [F41.0] 08/18/2017    Total Time spent with patient: 1 hour  Subjective:   Katherine Stephens is a 31 y.o. female patient admitted with "I am having this sounds in my ear".  HPI: Patient interviewed chart reviewed.  31 year old woman came into the emergency room complaining initially of discomfort and strange sounds in her left ear.  She describes the strange sounds as sounding like a crackling or rustling sound.  However she goes on to report that she also is hearing voices talking to her which she believes are coming from her computer.  She talks at length about how people in the government R "sound boarding" her by sending the voices of a man and a woman around her.  She responds to them during the time we are speaking and indicates that she is continuing to hear them.  She has paranoid ideation about it and talks about believes that the government is tracking her.  Patient is disorganized in her presentation and speech.  She reports that her mood has been a little bit anxious.  She denies however having any suicidal or homicidal ideation at all and there is no evidence that she has engaged in any dangerous behavior.  Patient denies that she is using any drugs or alcohol.   Does not report a specific new stress.  Social history: Patient reports she is originally from this part of [place] but has lived all over the country including in [place] and [place].  She has a friend who works here at the hospital who was visiting her in the emergency room.  Apparently her father lives in [place].  Patient says that she is working on writing a book and that she is able to survive because she gets alimony from her ex-husband who is a Banker.  Medical history: No known significant medical problem except for mild high blood pressure  Substance abuse history: Denies regular alcohol or drug use denies any past substance abuse issues no evidence of acute substance abuse  Past Psychiatric History: Patient was seen previously in our emergency room also for odd anxious symptoms at that time however it was not clear that they were psychotic in nature.  He was diagnosed with an anxiety attack.  Patient does admit that she has had one previous psychiatric hospitalization about which she will not give details.  Denies any past self injury or suicidal or violent behavior.  Risk to Self: Suicidal Ideation: No Suicidal Intent: No Is patient at risk for suicide?: No Suicidal Plan?: No Access to Means: No What has been your use of drugs/alcohol within the last 12 months?: Reports of none How many times?: 0 Other Self Harm Risks: Reports of none Triggers for Past Attempts: None known Intentional Self Injurious Behavior: None Risk to Others: Homicidal Ideation: No Thoughts of Harm to Others: No Current Homicidal Intent: No Current Homicidal Plan: No Access  to Homicidal Means: No Identified Victim: Reports of none History of harm to others?: No Assessment of Violence: None Noted Violent Behavior Description: Reports of none Does patient have access to weapons?: No Criminal Charges Pending?: No Does patient have a court date: No Prior Inpatient Therapy: Prior Inpatient  Therapy: No Prior Therapy Dates: Reports of none Prior Therapy Facilty/Provider(s): Reports of none Reason for Treatment: Reports of none Prior Outpatient Therapy: Prior Outpatient Therapy: Yes Prior Therapy Dates: Dates unknown Prior Therapy Facilty/Provider(s): Unable to remember the name of therapist Reason for Treatment: "Anxiety" Does patient have an ACCT team?: No Does patient have Intensive In-House Services?  : No Does patient have [facility] services? : No Does patient have P4CC services?: No  Past Medical History:  Past Medical History:  Diagnosis Date  . Hypertension     Past Surgical History:  Procedure Laterality Date  . NO PAST SURGERIES     Family History:  Family History  Problem Relation Age of Onset  . Hypertension Mother   . Hypertension Father   . Diabetes Maternal Grandmother    Family Psychiatric  History: She denies there being any family history of mental health problems Social History:  History  Alcohol Use  . Yes    Comment: 2 x week     History  Drug Use No    Social History   Social History  . Marital status: Divorced    Spouse name: N/A  . Number of children: N/A  . Years of education: N/A   Social History Main Topics  . Smoking status: Current Some Day Smoker    Types: Cigarettes  . Smokeless tobacco: Never Used     Comment: smokes cigarettes occasionally. Started smoking at age 83  . Alcohol use Yes     Comment: 2 x week  . Drug use: No  . Sexual activity: Not Currently   Other Topics Concern  . None   Social History Narrative  . None   Additional Social History:    Allergies:   Allergies  Allergen Reactions  . Amoxicillin   . Penicillins Hives    Has patient had a PCN reaction causing immediate rash, facial/tongue/throat swelling, SOB or lightheadedness with hypotension: Yes Has patient had a PCN reaction causing severe rash involving mucus membranes or skin necrosis: No Has patient had a PCN reaction that required  hospitalization: No Has patient had a PCN reaction occurring within the last 10 years: No If all of the above answers are "NO", then may proceed with Cephalosporin use.   [place] [Pseudoephedrine Hcl]     Labs:  Results for orders placed or performed during the hospital encounter of 10/18/17 (from the past 48 hour(s))  CBC with Differential/Platelet     Status: None   Collection Time: 10/18/17 10:06 AM  Result Value Ref Range   WBC 8.9 3.6 - 11.0 K/uL   RBC 5.12 3.80 - 5.20 MIL/uL   Hemoglobin 14.7 12.0 - 16.0 g/dL   HCT 44.3 35.0 - 47.0 %   MCV 86.6 80.0 - 100.0 fL   MCH 28.8 26.0 - 34.0 pg   MCHC 33.2 32.0 - 36.0 g/dL   RDW 12.6 11.5 - 14.5 %   Platelets 264 150 - 440 K/uL   Neutrophils Relative % 69 %   Neutro Abs 6.1 1.4 - 6.5 K/uL   Lymphocytes Relative 19 %   Lymphs Abs 1.7 1.0 - 3.6 K/uL   Monocytes Relative 8 %   Monocytes Absolute  0.7 0.2 - 0.9 K/uL   Eosinophils Relative 3 %   Eosinophils Absolute 0.3 0 - 0.7 K/uL   Basophils Relative 1 %   Basophils Absolute 0.1 0 - 0.1 K/uL  Comprehensive metabolic panel     Status: Abnormal   Collection Time: 10/18/17 10:06 AM  Result Value Ref Range   Sodium 138 135 - 145 mmol/L   Potassium 4.9 3.5 - 5.1 mmol/L   Chloride 104 101 - 111 mmol/L   CO2 25 22 - 32 mmol/L   Glucose, Bld 108 (H) 65 - 99 mg/dL   BUN 8 6 - 20 mg/dL   Creatinine, Ser 0.73 0.44 - 1.00 mg/dL   Calcium 9.3 8.9 - 10.3 mg/dL   Total Protein 7.8 6.5 - 8.1 g/dL   Albumin 4.6 3.5 - 5.0 g/dL   AST 21 15 - 41 U/L   ALT 14 14 - 54 U/L   Alkaline Phosphatase 36 (L) 38 - 126 U/L   Total Bilirubin 0.5 0.3 - 1.2 mg/dL   GFR calc non Af Amer >60 >60 mL/min   GFR calc Af Amer >60 >60 mL/min    Comment: (NOTE) The eGFR has been calculated using the CKD EPI equation. This calculation has not been validated in all clinical situations. eGFR's persistently <60 mL/min signify possible Chronic Kidney Disease.    Anion gap 9 5 - 15  Urine Drug Screen,  Qualitative ([facility] only)     Status: None   Collection Time: 10/18/17 10:06 AM  Result Value Ref Range   Tricyclic, Ur Screen NONE DETECTED NONE DETECTED   Amphetamines, Ur Screen NONE DETECTED NONE DETECTED   MDMA (Ecstasy)Ur Screen NONE DETECTED NONE DETECTED   Cocaine Metabolite,Ur [place] NONE DETECTED NONE DETECTED   Opiate, Ur Screen NONE DETECTED NONE DETECTED   Phencyclidine (PCP) Ur S NONE DETECTED NONE DETECTED   Cannabinoid 50 Ng, Ur [place] NONE DETECTED NONE DETECTED   Barbiturates, Ur Screen NONE DETECTED NONE DETECTED   Benzodiazepine, Ur Scrn NONE DETECTED NONE DETECTED   Methadone Scn, Ur NONE DETECTED NONE DETECTED    Comment: (NOTE) 854  Tricyclics, urine               Cutoff 1000 ng/mL 200  Amphetamines, urine             Cutoff 1000 ng/mL 300  MDMA (Ecstasy), urine           Cutoff 500 ng/mL 400  Cocaine Metabolite, urine       Cutoff 300 ng/mL 500  Opiate, urine                   Cutoff 300 ng/mL 600  Phencyclidine (PCP), urine      Cutoff 25 ng/mL 700  Cannabinoid, urine              Cutoff 50 ng/mL 800  Barbiturates, urine             Cutoff 200 ng/mL 900  Benzodiazepine, urine           Cutoff 200 ng/mL 1000 Methadone, urine                Cutoff 300 ng/mL 1100 1200 The urine drug screen provides only a preliminary, unconfirmed 1300 analytical test result and should not be used for non-medical 1400 purposes. Clinical consideration and professional judgment should 1500 be applied to any positive drug screen result due to possible 1600 interfering substances. A more specific alternate chemical method  1700 must be used in order to obtain a confirmed analytical result.  1800 Gas chromato graphy / mass spectrometry (GC/MS) is the preferred 1900 confirmatory method.   Ethanol     Status: None   Collection Time: 10/18/17 10:06 AM  Result Value Ref Range   Alcohol, Ethyl (B) <10 <10 mg/dL    Comment:        LOWEST DETECTABLE LIMIT FOR SERUM ALCOHOL IS 10 mg/dL FOR  MEDICAL PURPOSES ONLY   TSH     Status: None   Collection Time: 10/18/17 10:06 AM  Result Value Ref Range   TSH 0.915 0.350 - 4.500 uIU/mL    Comment: Performed by a 3rd Generation assay with a functional sensitivity of <=0.01 uIU/mL.  Pregnancy, urine     Status: None   Collection Time: 10/18/17 10:06 AM  Result Value Ref Range   Preg Test, Ur NEGATIVE NEGATIVE    No current facility-administered medications for this encounter.    Current Outpatient Prescriptions  Medication Sig Dispense Refill  . escitalopram (LEXAPRO) 10 MG tablet Take 1 tablet (10 mg total) by mouth daily. (Patient not taking: Reported on 09/29/2017) 30 tablet 2  . risperiDONE (RISPERDAL) 1 MG tablet Take 1 tablet (1 mg total) by mouth 2 (two) times daily. 60 tablet 1    Musculoskeletal: Strength & Muscle Tone: within normal limits Gait & Station: normal Patient leans: N/A  Psychiatric Specialty Exam: Physical Exam  Nursing note and vitals reviewed. Constitutional: She appears well-developed and well-nourished.  HENT:  Head: Normocephalic and atraumatic.  Eyes: Pupils are equal, round, and reactive to light. Conjunctivae are normal.  Neck: Normal range of motion.  Cardiovascular: Regular rhythm and normal heart sounds.   Respiratory: Effort normal and breath sounds normal. No respiratory distress.  GI: Soft.  Musculoskeletal: Normal range of motion.  Neurological: She is alert.  Skin: Skin is warm and dry.  Psychiatric: Her speech is normal. Her affect is blunt. She is agitated and actively hallucinating. She is not aggressive. Thought content is paranoid and delusional. Cognition and memory are impaired. She expresses impulsivity. She expresses no homicidal and no suicidal ideation.    Review of Systems  Constitutional: Negative.   HENT: Negative.        Patient is complaining of discomfort around her left ear  Eyes: Negative.   Respiratory: Negative.   Cardiovascular: Negative.    Gastrointestinal: Negative.   Musculoskeletal: Negative.   Skin: Negative.   Neurological: Negative.   Psychiatric/Behavioral: Positive for hallucinations. Negative for depression, memory loss, substance abuse and suicidal ideas. The patient is not nervous/anxious and does not have insomnia.     Blood pressure (!) 150/90, pulse 81, resp. rate 16, height _0  (1.702 m), weight 72.1 kg (159 lb), last menstrual period 09/27/2017, SpO2 100 %.Body mass index is 24.9 kg/m.  General Appearance: Fairly Groomed  Eye Contact:  Fair  Speech:  Clear and Coherent  Volume:  Normal  Mood:  Anxious and Dysphoric  Affect:  Blunt  Thought Process:  Disorganized  Orientation:  Full (Time, Place, and Person)  Thought Content:  Illogical, Hallucinations: Auditory and Paranoid Ideation  Suicidal Thoughts:  No  Homicidal Thoughts:  No  Memory:  Immediate;   Fair Recent;   Fair Remote;   Fair  Judgement:  Fair  Insight:  Fair  Psychomotor Activity:  Normal  Concentration:  Concentration: Fair  Recall:  [employer] of Knowledge:  Fair  Language:  Fair  Akathisia:  No  Handed:  Right  AIMS (if indicated):     Assets:  Desire for Improvement Housing Physical Health  ADL's:  Intact  Cognition:  WNL  Sleep:        Treatment Plan Summary: Medication management and Plan 31 year old woman with an unknown but probably significant past psychiatric history who is presenting to the emergency room with clear hallucinations.  She describes hearing voices that she believes are coming out of her computer when it is closed.  Also that she believes that people are transmitting voices into her head.  Talks in a paranoid manner about the government.  Nevertheless there is no evidence that she is dangerous.  No evidence that she is failing to take care of herself or that she is having any dangerous ideation or behavior.  Ordered a CAT scan which was done because of what may be new onset psychosis which is normal.   Reviewed directly the films.  Reviewed lab studies directly.  Case discussed with emergency room physician.  Patient has poor insight and I attempted to do some counseling with her to improve her insight and build some rapport.  Patient was informed of the probable diagnosis but is rejecting of that.  I recommend giving her a prescription for Risperdal at 1 mg twice a day side effects and use reviewed.  Patient strongly encouraged to follow-up with local mental health provider.  However, she at this point does not meet commitment criteria.  Discontinue proposed IVC.  Patient can be released.  Case reviewed with the ER doctor and TTS.  Disposition: No evidence of imminent risk to self or others at present.   Patient does not meet criteria for psychiatric inpatient admission. Supportive therapy provided about ongoing stressors.  Alethia Berthold, MD 10/18/2017 4:49 PM

## 2017-10-18 NOTE — ED Notes (Signed)
Patient refusing to let us draw her blood at this time until she drinks some water. Water given

## 2017-10-18 NOTE — ED Triage Notes (Addendum)
States 10 mins pta pt states she was on instagram and states 2 voices were "teleprompted" into her apartment at a high pitch that her left ear is not full of static, states the voices were Rico Junker and chole, states her family works in [employer] and is sabbotaging her with a sound board, pt trying to record during triage, pt calm, states bitterness on her tongue

## 2017-10-18 NOTE — BH Assessment (Signed)
Assessment Note  Katherine Stephens is an 31 y.o. female who presents to the ER due to concerns about her hearing. Per the report of the patient, President Trump, her ex-husband and Rico Junker are sending radio waves into her ear and her home. They are using "false sound boards" in order to make the projections. This morning the frequency was too high and it resulted in hearing static and difficulty hearing. Patient further reports, the projecting of sound waves/messages is an ongoing problem. The goal of using the waves and "sound boards" is to control people and their behaviors. "They did the same thing to get Trump our president.They were working with the Russians." She listed other people that are involved with the conspiracy. One is a "stupid model" and the others are businessmen who are connected to the "porn" industry.  During the interview, the patient was calm, cooperative and pleasant. She provided Probation officer with answers to the questions. She denies SI/HI and AV/H. However, she reports of hearing the president of the [place] and her ex-husband family members in her ear.  She denies history of abuse, aggression and violence. She denies the use of mind-altering substances and labs reflect the same.   Diagnosis: Psychosis  Past Medical History:  Past Medical History:  Diagnosis Date  . Hypertension     Past Surgical History:  Procedure Laterality Date  . NO PAST SURGERIES      Family History:  Family History  Problem Relation Age of Onset  . Hypertension Mother   . Hypertension Father   . Diabetes Maternal Grandmother     Social History:  reports that she has been smoking Cigarettes.  She has never used smokeless tobacco. She reports that she drinks alcohol. She reports that she does not use drugs.  Additional Social History:  Alcohol / Drug Use Pain Medications: See PTA Prescriptions: See PTA Over the Counter: See PTA History of alcohol / drug use?: No history of alcohol  / drug abuse Longest period of sobriety (when/how long): Reports of no past or current use of mind-altering substances Negative Consequences of Use:  (n/a) Withdrawal Symptoms:  (n/a)  CIWA:   COWS:    Allergies:  Allergies  Allergen Reactions  . Amoxicillin   . Penicillins   . Sudafed [Pseudoephedrine Hcl]     Home Medications:  (Not in a hospital admission)  OB/GYN Status:  Patient's last menstrual period was 09/27/2017 (approximate).  General Assessment Data Assessment unable to be completed: Yes Location of Assessment: [facility] ED TTS Assessment: In system Is this a Tele or Face-to-Face Assessment?: Face-to-Face Is this an Initial Assessment or a Re-assessment for this encounter?: Initial Assessment Marital status: Divorced [place] name: n/a Is patient pregnant?: No Pregnancy Status: No Living Arrangements: Alone Can pt return to current living arrangement?: Yes Admission Status: Involuntary Is patient capable of signing voluntary admission?: No (Under IVC) Referral Source: Self/Family/Friend Insurance type: None  Medical Screening Exam ([place]) Medical Exam completed: Yes  Crisis Care Plan Living Arrangements: Alone Legal Guardian: Other: (Self) Name of Psychiatrist: Reports of none Name of Therapist: Reports of none  Education Status Is patient currently in school?: No Current Grade: n/a Highest grade of school patient has completed: [employer] Name of school: n/a Contact person: n/a  Risk to self with the past 6 months Suicidal Ideation: No Has patient been a risk to self within the past 6 months prior to admission? : No Suicidal Intent: No Has patient had any suicidal intent within  the past 6 months prior to admission? : No Is patient at risk for suicide?: No Suicidal Plan?: No Has patient had any suicidal plan within the past 6 months prior to admission? : No Access to Means: No What has been your use of drugs/alcohol within the last 12  months?: Reports of none Previous Attempts/Gestures: No How many times?: 0 Other Self Harm Risks: Reports of none Triggers for Past Attempts: None known Intentional Self Injurious Behavior: None Family Suicide History: Unknown Recent stressful life event(s): Other (Comment) Persecutory voices/beliefs?: Yes Depression: Yes Depression Symptoms: Isolating Substance abuse history and/or treatment for substance abuse?: No Suicide prevention information given to non-admitted patients: Not applicable  Risk to Others within the past 6 months Homicidal Ideation: No Does patient have any lifetime risk of violence toward others beyond the six months prior to admission? : No Thoughts of Harm to Others: No Current Homicidal Intent: No Current Homicidal Plan: No Access to Homicidal Means: No Identified Victim: Reports of none History of harm to others?: No Assessment of Violence: None Noted Violent Behavior Description: Reports of none Does patient have access to weapons?: No Criminal Charges Pending?: No Does patient have a court date: No Is patient on probation?: No  Psychosis Hallucinations: Auditory Delusions: Grandiose, Persecutory, Somatic  Mental Status Report Appearance/Hygiene: Unremarkable, In scrubs Eye Contact: Fair Motor Activity: Freedom of movement, Unremarkable Speech: Logical/coherent Level of Consciousness: Alert Mood: Suspicious, Anxious, Fearful, Pleasant Affect: Appropriate to circumstance, Anxious Anxiety Level: Moderate Thought Processes: Coherent, Relevant Judgement: Partial Orientation: Person, Place, Time, Appropriate for developmental age Obsessive Compulsive Thoughts/Behaviors: Moderate  Cognitive Functioning Concentration: Decreased Memory: Recent Intact, Remote Intact IQ: Average Insight: Fair Impulse Control: Fair Appetite: Good Weight Loss: 0 Weight Gain: 0 Sleep: No Change Total Hours of Sleep: 8 Vegetative Symptoms: None  ADLScreening  [facility] Assessment Services) Patient's cognitive ability adequate to safely complete daily activities?: Yes Patient able to express need for assistance with ADLs?: Yes Independently performs ADLs?: Yes (appropriate for developmental age)  Prior Inpatient Therapy Prior Inpatient Therapy: No Prior Therapy Dates: Reports of none Prior Therapy Facilty/Provider(s): Reports of none Reason for Treatment: Reports of none  Prior Outpatient Therapy Prior Outpatient Therapy: Yes Prior Therapy Dates: Dates unknown Prior Therapy Facilty/Provider(s): Unable to remember the name of therapist Reason for Treatment: "Anxiety" Does patient have an ACCT team?: No Does patient have Intensive In-House Services?  : No Does patient have [facility] services? : No Does patient have P4CC services?: No  ADL Screening (condition at time of admission) Patient's cognitive ability adequate to safely complete daily activities?: Yes Is the patient deaf or have difficulty hearing?: No Does the patient have difficulty seeing, even when wearing glasses/contacts?: No Does the patient have difficulty concentrating, remembering, or making decisions?: No Patient able to express need for assistance with ADLs?: Yes Does the patient have difficulty dressing or bathing?: No Independently performs ADLs?: Yes (appropriate for developmental age) Does the patient have difficulty walking or climbing stairs?: No Weakness of Legs: None Weakness of Arms/Hands: None  Home Assistive Devices/Equipment Home Assistive Devices/Equipment: None  Therapy Consults (therapy consults require a physician order) PT Evaluation Needed: No OT Evalulation Needed: No SLP Evaluation Needed: No Abuse/Neglect Assessment (Assessment to be complete while patient is alone) Physical Abuse: Denies Verbal Abuse: Denies Sexual Abuse: Denies Exploitation of patient/patient's resources: Denies Self-Neglect: Denies Values / Beliefs Cultural Requests During  Hospitalization: None Spiritual Requests During Hospitalization: None Consults Spiritual Care Consult Needed: No Social Work Consult Needed: No  Additional Information 1:1 In Past 12 Months?: No CIRT Risk: No Elopement Risk: No Does patient have medical clearance?: Yes  Child/Adolescent Assessment Running Away Risk: Denies (Patient is an adult)  Disposition:  Disposition Initial Assessment Completed for this Encounter: Yes Disposition of Patient: Pending Review with psychiatrist  On Site Evaluation by:   Reviewed with Physician:    Gunnar Fusi MS, LCAS, [place], [place], CCSI Therapeutic Triage Specialist 10/18/2017 12:05 PM

## 2017-10-18 NOTE — ED Provider Notes (Addendum)
[facility] Emergency Department Provider Note       Time seen: ----------------------------------------- 10:08 AM on 10/18/2017 -----------------------------------------     I have reviewed the triage vital signs and the nursing notes.   HISTORY   Chief Complaint Otalgia    HPI Katherine Stephens is a 31 y.o. female with a history of anxiety who presents to the ED for auditory hallucinations. Patient states 10 minutes prior to arrival she was on angiogram when she states to voices were telling prompted into her apartment particularly in her left ear. Patient states the voices were 2 notable public figures. Patient states her family works in [employer] and has been sabotaging her through some sort of forced manipulation. She was trying to record the triage nurse on arrival here.  Past Medical History:  Diagnosis Date  . Hypertension     Patient Active Problem List   Diagnosis Date Noted  . ASCUS with positive high risk HPV cervical 10/07/2017  . Hypertension 09/29/2017  . Tobacco use 09/23/2017  . Anxiety attack 08/18/2017    Past Surgical History:  Procedure Laterality Date  . NO PAST SURGERIES      Allergies Amoxicillin; Penicillins; and Sudafed [pseudoephedrine hcl]  Social History Social History  Substance Use Topics  . Smoking status: Current Some Day Smoker    Types: Cigarettes  . Smokeless tobacco: Never Used     Comment: smokes cigarettes occasionally. Started smoking at age 3  . Alcohol use Yes     Comment: 2 x week    Review of Systems Constitutional: Negative for fever. Eyes: Negative for vision changes ENT:  Negative for congestion, sore throat. Positive for abnormal hearing in the left ear Cardiovascular: Negative for chest pain. Respiratory: Negative for shortness of breath. Gastrointestinal: Negative for abdominal pain, vomiting and diarrhea. Genitourinary: Negative for dysuria. Musculoskeletal: Negative for back  pain. Skin: Negative for rash. Neurological: Negative for headaches, focal weakness or numbness.  psychiatric: positive for auditory hallucinations  All systems negative/normal/unremarkable except as stated in the HPI  ____________________________________________   PHYSICAL EXAM:  VITAL SIGNS: ED Triage Vitals [10/18/17 0950]  Enc Vitals Group     BP      Pulse      Resp      Temp      Temp src      SpO2      Weight 159 lb (72.1 kg)     Height 5\' 7"  (1.702 m)     Head Circumference      Peak Flow      Pain Score 0     Pain Loc      Pain Edu?      Excl. in [place]?     Constitutional: Alert and oriented. Well appearing and in no distress. Eyes: Conjunctivae are normal. Normal extraocular movements. ENT   Head: Normocephalic and atraumatic.TMs are clear   Nose: No congestion/rhinnorhea.   Mouth/Throat: Mucous membranes are moist.   Neck: No stridor. Cardiovascular: Normal rate, regular rhythm. No murmurs, rubs, or gallops. Respiratory: Normal respiratory effort without tachypnea nor retractions. Breath sounds are clear and equal bilaterally. No wheezes/rales/rhonchi. Gastrointestinal: Soft and nontender. Normal bowel sounds Musculoskeletal: Nontender with normal range of motion in extremities. No lower extremity tenderness nor edema. Neurologic:  Normal speech and language. No gross focal neurologic deficits are appreciated.  Skin:  Skin is warm, dry and intact. No rash noted. Psychiatric: Mood and affect are normal. Speech and behavior are normal.  ____________________________________________  ED COURSE:  Pertinent labs & imaging results that were available during my care of the patient were reviewed by me and considered in my medical decision making (see chart for details). Patient presents for auditory hallucinations, this appears to be an acute psychotic break and I will consult psychiatry and proceed with involuntary commitment. Clinical Course as of Oct 18 1446  Tue Oct 18, 2017  1411 head CT is negative  [JW]    Clinical Course User Index [JW] Earleen Newport, MD   Procedures ____________________________________________   LABS (pertinent positives/negatives)  Labs Reviewed  COMPREHENSIVE METABOLIC PANEL - Abnormal; Notable for the following:       Result Value   Glucose, Bld 108 (*)    Alkaline Phosphatase 36 (*)    All other components within normal limits  CBC WITH DIFFERENTIAL/PLATELET  URINE DRUG SCREEN, QUALITATIVE ([facility] ONLY)  ETHANOL  TSH  PREGNANCY, URINE  ____________________________________________  DIFFERENTIAL DIAGNOSIS   auditory hallucinations, schizophrenia, acute psychosis, drug abuse, intoxication   FINAL ASSESSMENT AND PLAN  auditory hallucination  Plan: Patient had presented for auditory hallucinations indicating acute psychosis. she has been placed under involuntary commitment and is stable for psychiatric evaluation and disposition.  patient was evaluated by psychiatry and although she has auditory hallucinations, she is not felt to be a threat to herself or others at this time. She has been cleared by Dr. Weber Cooks for outpatient follow-up. Earleen Newport, MD   Note: This note was generated in part or whole with voice recognition software. Voice recognition is usually quite accurate but there are transcription errors that can and very often do occur. I apologize for any typographical errors that were not detected and corrected.     Earleen Newport, MD 10/18/17 1015    Earleen Newport, MD 10/18/17 1156    Earleen Newport, MD 10/18/17 [phone]

## 2017-10-18 NOTE — ED Notes (Addendum)
Patient reports to this RN and MD that for the past two weeks the president has been tele prompting into her R ear through satellite about golf and numerous things. She believes they want her to come work for him and she states she does not want too. Patient says this has been going on for the past two years. She denies SI/HI. She came in today because her L ear feels crackly especially when she blows up her cheek on the left side. She also states "I have a bitterness on my tongue that tastes like a banana peel" when I asked her what she ate this morning she reported "I had a banana"

## 2017-10-25 ENCOUNTER — Telehealth: Payer: Self-pay | Admitting: Physician Assistant

## 2017-10-25 NOTE — Telephone Encounter (Signed)
Pt stated at [place] she discussed interest of having mole removed. Pt is requesting referral to [facility] for the mole removal and that she thinks she has a lump on the base of her neck that recently came up. Pt wasn't interested in scheduling an OV to have the lump checked. Please advise. Thanks TNP

## 2017-10-26 NOTE — Telephone Encounter (Signed)
LMTCB 10/26/2017   Thanks,   -Mickel Baas

## 2017-10-26 NOTE — Telephone Encounter (Signed)
Patient advised. She states she will contact [facility] and scheduled appointment.

## 2017-10-26 NOTE — Telephone Encounter (Signed)
I have placed referral to [facility]. They have tried contacting multiple times voicemail full. Does she still want this referrall? If not then would be happy to place referral for mole removal at [place] [facility]. Will not refer for neck lump as I have not personally assessed this.

## 2017-11-23 ENCOUNTER — Encounter: Payer: Self-pay | Admitting: Physician Assistant

## 2017-12-16 ENCOUNTER — Encounter (INDEPENDENT_AMBULATORY_CARE_PROVIDER_SITE_OTHER): Payer: Self-pay

## 2017-12-20 ENCOUNTER — Encounter: Payer: Self-pay | Admitting: Emergency Medicine

## 2017-12-20 ENCOUNTER — Emergency Department
Admission: EM | Admit: 2017-12-20 | Discharge: 2017-12-21 | Disposition: A | Discharge status: Home / Self Care | Payer: Self-pay | Attending: Emergency Medicine | Admitting: Emergency Medicine

## 2017-12-20 DIAGNOSIS — F29 Unspecified psychosis not due to a substance or known physiological condition: Secondary | ICD-10-CM | POA: Insufficient documentation

## 2017-12-20 DIAGNOSIS — I10 Essential (primary) hypertension: Secondary | ICD-10-CM | POA: Insufficient documentation

## 2017-12-20 DIAGNOSIS — Z91199 Patient's noncompliance with other medical treatment and regimen due to unspecified reason: Secondary | ICD-10-CM

## 2017-12-20 DIAGNOSIS — Z9119 Patient's noncompliance with other medical treatment and regimen: Secondary | ICD-10-CM

## 2017-12-20 DIAGNOSIS — F17210 Nicotine dependence, cigarettes, uncomplicated: Secondary | ICD-10-CM | POA: Insufficient documentation

## 2017-12-20 DIAGNOSIS — F312 Bipolar disorder, current episode manic severe with psychotic features: Secondary | ICD-10-CM

## 2017-12-20 LAB — URINE DRUG SCREEN, QUALITATIVE (ARMC ONLY)
AMPHETAMINES, UR SCREEN: NOT DETECTED
BENZODIAZEPINE, UR SCRN: NOT DETECTED
Barbiturates, Ur Screen: NOT DETECTED
Cannabinoid 50 Ng, Ur ~~LOC~~: NOT DETECTED
Cocaine Metabolite,Ur ~~LOC~~: NOT DETECTED
MDMA (Ecstasy)Ur Screen: NOT DETECTED
Methadone Scn, Ur: NOT DETECTED
Opiate, Ur Screen: NOT DETECTED
PHENCYCLIDINE (PCP) UR S: NOT DETECTED
TRICYCLIC, UR SCREEN: NOT DETECTED

## 2017-12-20 LAB — COMPREHENSIVE METABOLIC PANEL
ALT: 17 U/L (ref 14–54)
AST: 24 U/L (ref 15–41)
Albumin: 4.3 g/dL (ref 3.5–5.0)
Alkaline Phosphatase: 37 U/L — ABNORMAL LOW (ref 38–126)
Anion gap: 8 (ref 5–15)
BUN: 13 mg/dL (ref 6–20)
CHLORIDE: 107 mmol/L (ref 101–111)
CO2: 24 mmol/L (ref 22–32)
Calcium: 9.1 mg/dL (ref 8.9–10.3)
Creatinine, Ser: 0.65 mg/dL (ref 0.44–1.00)
GFR calc Af Amer: >60 mL/min (ref >60)
GFR calc non Af Amer: >60 mL/min (ref >60)
Glucose, Bld: 207 mg/dL — ABNORMAL HIGH (ref 65–99)
POTASSIUM: 3.1 mmol/L — AB (ref 3.5–5.1)
Sodium: 139 mmol/L (ref 135–145)
Total Bilirubin: 0.4 mg/dL (ref 0.3–1.2)
Total Protein: 7.1 g/dL (ref 6.5–8.1)

## 2017-12-20 LAB — URINALYSIS, COMPLETE (UACMP) WITH MICROSCOPIC
Bilirubin Urine: NEGATIVE
GLUCOSE, UA: NEGATIVE mg/dL
KETONES UR: NEGATIVE mg/dL
LEUKOCYTES UA: NEGATIVE
Nitrite: NEGATIVE
PROTEIN: NEGATIVE mg/dL
Specific Gravity, Urine: 1.002 — ABNORMAL LOW (ref 1.005–1.030)
pH: 6 (ref 5.0–8.0)

## 2017-12-20 LAB — ETHANOL: Alcohol, Ethyl (B): <10 mg/dL (ref <10)

## 2017-12-20 LAB — CBC
HEMATOCRIT: 41.8 % (ref 35.0–47.0)
Hemoglobin: 14.3 g/dL (ref 12.0–16.0)
MCH: 29.1 pg (ref 26.0–34.0)
MCHC: 34.2 g/dL (ref 32.0–36.0)
MCV: 85.3 fL (ref 80.0–100.0)
Platelets: 281 10*3/uL (ref 150–440)
RBC: 4.9 MIL/uL (ref 3.80–5.20)
RDW: 13.1 % (ref 11.5–14.5)
WBC: 8.4 10*3/uL (ref 3.6–11.0)

## 2017-12-20 LAB — HCG, QUANTITATIVE, PREGNANCY: hCG, Beta Chain, Quant, S: 1 m[IU]/mL (ref <5)

## 2017-12-20 MED ORDER — HALOPERIDOL LACTATE 5 MG/ML IJ SOLN
5.0000 mg | Freq: Once | INTRAMUSCULAR | Status: AC
  Administered 2017-12-20: 5 mg via INTRAMUSCULAR
  Filled 2017-12-20: qty 1

## 2017-12-20 MED ORDER — DIPHENHYDRAMINE HCL 50 MG/ML IJ SOLN
50.0000 mg | Freq: Once | INTRAMUSCULAR | Status: AC
  Administered 2017-12-20: 50 mg via INTRAMUSCULAR
  Filled 2017-12-20: qty 1

## 2017-12-20 MED ORDER — LORAZEPAM 2 MG/ML IJ SOLN
2.0000 mg | Freq: Once | INTRAMUSCULAR | Status: AC
  Administered 2017-12-20: 2 mg via INTRAMUSCULAR
  Filled 2017-12-20: qty 1

## 2017-12-20 NOTE — ED Notes (Signed)
SOC Completed.

## 2017-12-20 NOTE — ED Notes (Signed)
Patient's father, Katherine Stephens left his phone number [phone].

## 2017-12-20 NOTE — ED Notes (Signed)
Report given to Dr Christophe Louis, [facility] MD.

## 2017-12-20 NOTE — ED Provider Notes (Signed)
[facility] Emergency Department Provider Note  ____________________________________________   First MD Initiated Contact with Patient 12/20/17 1923     (approximate)  I have reviewed the triage vital signs and the nursing notes.   HISTORY  Chief Complaint Requesting UA  Level 5 exemption history limited by the patient's psychosis  HPI Katherine Stephens is a 32 y.o. female who self presents to the emergency department requesting a urinalysis.  She is concerned because she feels like her earwax is started coming out of her vagina and she needs to have her urine checked so that the FBI can make sure that she is safe.  She is also concerned because she has been seen in our hospital in the past by her psychiatrist Dr. Weber Cooks and she says she is reported him to the medical board and also to the [employer] because he has been making lies about her.  Past Medical History:  Diagnosis Date  . Hypertension     Patient Active Problem List   Diagnosis Date Noted  . 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 Auditory hallucinations 10/18/2017  . Poor insight into neurotic condition 10/18/2017  . ASCUS with positive high risk HPV cervical 10/07/2017  . Hypertension 09/29/2017  . Tobacco use 09/23/2017  . Anxiety attack 08/18/2017    Past Surgical History:  Procedure Laterality Date  . NO PAST SURGERIES      Prior to Admission medications   Medication Sig Start Date End Date Taking? Authorizing Provider  escitalopram (LEXAPRO) 10 MG tablet Take 1 tablet (10 mg total) by mouth daily. Patient not taking: Reported on 09/29/2017 09/23/17 12/22/17  Trinna Post, PA-C  risperiDONE (RISPERDAL) 1 MG tablet Take 1 tablet (1 mg total) by mouth 2 (two) times daily. 10/18/17 10/18/18  Clapacs, Madie Reno, MD    Allergies Amoxicillin; Penicillins; and Sudafed [pseudoephedrine hcl]  Family History  Problem Relation Age of Onset  . Hypertension Mother   .  Hypertension Father   . Diabetes Maternal Grandmother     Social History Social History   Tobacco Use  . Smoking status: Current Some Day Smoker    Types: Cigarettes  . Smokeless tobacco: Never Used  . Tobacco comment: smokes cigarettes occasionally. Started smoking at age 65  Substance Use Topics  . Alcohol use: Yes    Comment: 2 x week  . Drug use: No    Review of Systems Level 5 exemption history limited by the patient's psychosis ____________________________________________   PHYSICAL EXAM:  VITAL SIGNS: ED Triage Vitals [12/20/17 1853]  Enc Vitals Group     BP (!) 161/98     Pulse Rate (!) 107     Resp 17     Temp 97.8 F (36.6 C)     Temp Source Oral     SpO2 100 %     Weight 160 lb (72.6 kg)     Height      Head Circumference      Peak Flow      Pain Score      Pain Loc      Pain Edu?      Excl. in [place]?     Constitutional: While the patient is in no acute distress, she is clearly manic and psychotic and demonstrating bizarre behavior Eyes: PERRL EOMI. mid range and brisk Head: Atraumatic. Nose: No congestion/rhinnorhea. Mouth/Throat: No trismus Neck: No stridor.   Cardiovascular: Cardiac rate, regular rhythm. Grossly normal heart sounds.  Good peripheral circulation. Respiratory:  Normal respiratory effort.  No retractions. Lungs CTAB and moving good air Gastrointestinal: Soft nontender Musculoskeletal: No lower extremity edema   Neurologic:  . No gross focal neurologic deficits are appreciated. Skin:  Skin is warm, dry and intact. No rash noted. Psychiatric: Pressured speech, manic, psychotic    ____________________________________________   DIFFERENTIAL includes but not limited to  Schizophrenia, schizoaffective disorder, drug overdose, infection ____________________________________________   LABS (all labs ordered are listed, but only abnormal results are displayed)  Labs Reviewed  URINALYSIS, COMPLETE (UACMP) WITH MICROSCOPIC -  Abnormal; Notable for the following components:      Result Value   Color, Urine COLORLESS (*)    APPearance CLEAR (*)    Specific Gravity, Urine 1.002 (*)    Hgb urine dipstick MODERATE (*)    Bacteria, UA RARE (*)    Squamous Epithelial / LPF 0-5 (*)    All other components within normal limits  COMPREHENSIVE METABOLIC PANEL - Abnormal; Notable for the following components:   Potassium 3.1 (*)    Glucose, Bld 207 (*)    Alkaline Phosphatase 37 (*)    All other components within normal limits  URINE DRUG SCREEN, QUALITATIVE ([facility] ONLY)  CBC  ETHANOL  HCG, QUANTITATIVE, PREGNANCY    Lab work reviewed by me with slightly elevated glucose of unclear clinical significance otherwise unremarkable __________________________________________  EKG   ____________________________________________  RADIOLOGY   ____________________________________________   PROCEDURES  Procedure(s) performed: no  .Critical Care Performed by: Darel Hong, MD Authorized by: Darel Hong, MD   Critical care provider statement:    Critical care time (minutes):  30   Critical care time was exclusive of:  Separately billable procedures and treating other patients   Critical care was necessary to treat or prevent imminent or life-threatening deterioration of the following conditions:  CNS failure or compromise   Critical care was time spent personally by me on the following activities:  Development of treatment plan with patient or surrogate, discussions with consultants, evaluation of patient's response to treatment, examination of patient, obtaining history from patient or surrogate, ordering and performing treatments and interventions, ordering and review of laboratory studies, ordering and review of radiographic studies, pulse oximetry, re-evaluation of patient's condition and review of old charts    Critical Care performed: no  Observation:  no ____________________________________________   INITIAL IMPRESSION / ASSESSMENT AND PLAN / ED COURSE  Pertinent labs & imaging results that were available during my care of the patient were reviewed by me and considered in my medical decision making (see chart for details).  On arrival the patient is demonstrating clearly manic and psychotic behavior.  Thought process is nonlinear and extremely paranoid.  When I discussed with her the need to see a psychiatrist she became verbally abusive combative and attempted to leave.  The patient is in imminent danger to herself and intramuscular medications were given for the patient's own safety to facilitate a medical workup searching for an organic cause of the behavior.  I placed the patient on involuntary commitment.  She is medically stable for psychiatric evaluation.      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 Use AVSMEDLIST instead to display the medication list for a patient.   Note:  This document was prepared using Dragon voice recognition software and may include unintentional dictation errors.  Darel Hong, MD 12/21/17 [phone]

## 2017-12-20 NOTE — ED Notes (Signed)
When the patient was requested to move from room 24 to room 23, pt became extremely upset and wanting to leave; when requested by this RN and charge RN, patient refused to give up her cell phone, watch and ring that she was wearing. Pt became physically aggressive and started yelling at the staff and security officers. MD was notified and orders received to give the patient medication. Medications administered while the patient was being held down by Animal nutritionist and Agricultural consultant. After administration of medication, pt willingly surrendered her phone, ring and watch; they are secured with her other belonging in a patient belonging's bag, bag is labeled and secured in the [facility] locker room. At this time patient is calm, and cooperative.

## 2017-12-20 NOTE — ED Notes (Signed)
Pants, socks, shoes, bra and shirt removed and placed in belonging bag. Pt has a jacket and purse with her also. Pearl earrings place in urine cup and then into belongings bag. Pt refuses to take off watch and wedding bang, pt here under voluntary commitment for paranoid behavior and will not let me take her belongings from her bedside. Caryl Pina RN made aware.

## 2017-12-20 NOTE — ED Triage Notes (Signed)
Patient presents to ED via POV from home requesting UA for "the goverment". Patient with government delusions . "They are hitting my ear. You will hear from a government official about this. Them hitting my ear is causing built up ear wax and vaginal discharge". Patient requesting her UA results be given to her so she can file a complaint with the government.

## 2017-12-20 NOTE — BH Assessment (Signed)
Assessment Note  Katherine Stephens is an 31 y.o. female presenting to the ED for a urinalysis because she is concerned that her earwax if coming our of her vagina.  Patient states that she wants to the send results of her urinalysis to the government I order to file a complaint.    Patient presented to the ED on 10/18/17 for auditory hallucinations and paranoia about the government (the president of the [place]) sending radio waves into her ear and and home.    Pt denies SI/HI and the use of any mind-altering substances.  She denies any history of aggression or violence towards others.    Diagnosis: Psychosis Past Medical History:  Past Medical History:  Diagnosis Date  . Hypertension     Past Surgical History:  Procedure Laterality Date  . NO PAST SURGERIES      Family History:  Family History  Problem Relation Age of Onset  . Hypertension Mother   . Hypertension Father   . Diabetes Maternal Grandmother     Social History:  reports that she has been smoking cigarettes.  she has never used smokeless tobacco. She reports that she drinks alcohol. She reports that she does not use drugs.  Additional Social History:  Alcohol / Drug Use Pain Medications: See PTA Prescriptions: See PTA Over the Counter: See PTA History of alcohol / drug use?: No history of alcohol / drug abuse  CIWA: CIWA-Ar BP: (!) 161/98 Pulse Rate: (!) 107 COWS:    Allergies:  Allergies  Allergen Reactions  . Amoxicillin   . Penicillins Hives    Has patient had a PCN reaction causing immediate rash, facial/tongue/throat swelling, SOB or lightheadedness with hypotension: Yes Has patient had a PCN reaction causing severe rash involving mucus membranes or skin necrosis: No Has patient had a PCN reaction that required hospitalization: No Has patient had a PCN reaction occurring within the last 10 years: No If all of the above answers are "NO", then may proceed with Cephalosporin use.   Ebbie Ridge  [Pseudoephedrine Hcl]     Home Medications:  (Not in a hospital admission)  OB/GYN Status:  Patient's last menstrual period was 12/20/2017.  General Assessment Data Location of Assessment: [facility] ED TTS Assessment: In system Is this a Tele or Face-to-Face Assessment?: Face-to-Face Is this an Initial Assessment or a Re-assessment for this encounter?: Initial Assessment Marital status: Single Maiden name: na Is patient pregnant?: No Pregnancy Status: No Living Arrangements: Alone Can pt return to current living arrangement?: Yes Admission Status: Involuntary Is patient capable of signing voluntary admission?: No Referral Source: Self/Family/Friend Insurance type: None     Crisis Care Plan Living Arrangements: Alone Legal Guardian: Other:(self) Name of Psychiatrist: unknown Name of Therapist: unknown  Education Status Is patient currently in school?: No Current Grade: na Highest grade of school patient has completed: some college Name of school: na Contact person: na  Risk to self with the past 6 months Suicidal Ideation: No Has patient been a risk to self within the past 6 months prior to admission? : No Suicidal Intent: No Has patient had any suicidal intent within the past 6 months prior to admission? : No Is patient at risk for suicide?: No Suicidal Plan?: No Has patient had any suicidal plan within the past 6 months prior to admission? : No Access to Means: No What has been your use of drugs/alcohol within the last 12 months?: none reported Previous Attempts/Gestures: No Other Self Harm Risks: none identified Triggers for  Past Attempts: None known Intentional Self Injurious Behavior: None Family Suicide History: No Recent stressful life event(s): Other (Comment) Persecutory voices/beliefs?: No Depression: No Substance abuse history and/or treatment for substance abuse?: No Suicide prevention information given to non-admitted patients: Not applicable  Risk  to Others within the past 6 months Homicidal Ideation: No Does patient have any lifetime risk of violence toward others beyond the six months prior to admission? : No Thoughts of Harm to Others: No Current Homicidal Intent: No Current Homicidal Plan: No Access to Homicidal Means: No Identified Victim: none identified History of harm to others?: No Assessment of Violence: None Noted Does patient have access to weapons?: No Criminal Charges Pending?: No Does patient have a court date: No Is patient on probation?: No  Psychosis Hallucinations: None noted Delusions: Persecutory  Mental Status Report Appearance/Hygiene: In scrubs, Bizarre Eye Contact: Fair Motor Activity: Freedom of movement, Agitation Speech: Incoherent Level of Consciousness: Sedated Mood: Irritable Affect: Inconsistent with thought content Anxiety Level: Minimal Thought Processes: Flight of Ideas Judgement: Partial Orientation: Person Obsessive Compulsive Thoughts/Behaviors: Minimal  Cognitive Functioning Concentration: Fair Memory: Remote Intact IQ: Average Insight: Fair Impulse Control: Fair Appetite: Fair Weight Loss: 0 Weight Gain: 0 Sleep: No Change Total Hours of Sleep: 8 Vegetative Symptoms: None  ADLScreening [facility] Assessment Services) Patient's cognitive ability adequate to safely complete daily activities?: Yes Patient able to express need for assistance with ADLs?: Yes Independently performs ADLs?: Yes (appropriate for developmental age)  Prior Inpatient Therapy Prior Inpatient Therapy: No Prior Therapy Dates: na Prior Therapy Facilty/Provider(s): na Reason for Treatment: na  Prior Outpatient Therapy Prior Outpatient Therapy: Yes Prior Therapy Dates: unknown Prior Therapy Facilty/Provider(s): unknown Reason for Treatment: unknown Does patient have an ACCT team?: Unknown Does patient have Intensive In-House Services?  : No Does patient have [facility] services? : Unknown Does  patient have P4CC services?: Unknown  ADL Screening (condition at time of admission) Patient's cognitive ability adequate to safely complete daily activities?: Yes Patient able to express need for assistance with ADLs?: Yes Independently performs ADLs?: Yes (appropriate for developmental age)       Abuse/Neglect Assessment (Assessment to be complete while patient is alone) Abuse/Neglect Assessment Can Be Completed: Yes Physical Abuse: Denies Verbal Abuse: Denies Sexual Abuse: Denies Exploitation of patient/patient's resources: Denies Self-Neglect: Denies Values / Beliefs Cultural Requests During Hospitalization: None Spiritual Requests During Hospitalization: None Consults Spiritual Care Consult Needed: No Social Work Consult Needed: No Regulatory affairs officer (For Healthcare) Does Patient Have a Medical Advance Directive?: No    Additional Information 1:1 In Past 12 Months?: No CIRT Risk: No Elopement Risk: No Does patient have medical clearance?: Yes     Disposition:  Disposition Initial Assessment Completed for this Encounter: Yes Disposition of Patient: Pending Review with psychiatrist  On Site Evaluation by:   Reviewed with Physician:    Oneita Hurt 12/20/2017 11:14 PM

## 2017-12-20 NOTE — ED Notes (Signed)
When requested to have blood drawn for labs, pt states she "feels dry" at this time and requested to have some water prior to having blood drawn. Pt provided with a cup of ice water. Will attempt to draw blood after the patient has drank the water.

## 2017-12-20 NOTE — ED Notes (Signed)
Pt states, "I keep getting my ears hit, its causing a lot of ear wax and I am passing wax with my menstrual cycle, and every time I wipe I am seeing a lot of ear wax."  EDP to bedside at this time.

## 2017-12-21 DIAGNOSIS — F203 Undifferentiated schizophrenia: Secondary | ICD-10-CM

## 2017-12-21 DIAGNOSIS — F312 Bipolar disorder, current episode manic severe with psychotic features: Secondary | ICD-10-CM

## 2017-12-21 DIAGNOSIS — Z9119 Patient's noncompliance with other medical treatment and regimen: Secondary | ICD-10-CM

## 2017-12-21 DIAGNOSIS — Z91199 Patient's noncompliance with other medical treatment and regimen due to unspecified reason: Secondary | ICD-10-CM

## 2017-12-21 NOTE — ED Provider Notes (Signed)
-----------------------------------------   7:07 PM on 12/21/2017 -----------------------------------------   Blood pressure 126/81, pulse 99, temperature 97.9 F (36.6 C), temperature source Oral, resp. rate 18, weight 72.6 kg (160 lb), last menstrual period 12/20/2017, SpO2 100 %.  Patient has been evaluated by psychiatry and cleared for discharge. IVC lifted by Dr. Weber Cooks. Patient's labs have been reviewed with no acute findings. Patient will be discharged at this time to home    Alfred Levins, [place], MD 12/21/17 [phone]

## 2017-12-21 NOTE — ED Notes (Signed)
Patient calm and cooperative but guarded upon admittance to unit.  Meal tray and drink given.  Patient oriented to unit.  No distress noted.

## 2017-12-21 NOTE — Discharge Instructions (Signed)
You have been seen in the Emergency Department (ED)  today for a psychiatric complaint.  You have been evaluated by psychiatry and we believe you are safe to be discharged from the hospital.   ° °Please return to the Emergency Department (ED)  immediately if you have ANY thoughts of hurting yourself or anyone else, so that we may help you. ° °Please avoid alcohol and drug use. ° °Follow up with your doctor and/or therapist as soon as possible regarding today's ED  visit.  ° °You may call crisis hotline for [place] at [phone]. ° °

## 2017-12-21 NOTE — ED Notes (Signed)
Pt to be discharged from ER.  Maintained on 15 minute checks and observation by security camera for safety.

## 2017-12-21 NOTE — ED Notes (Signed)
Pt asking staff when she will be discharged from the ER. "I only came in for a UA. I don't know why I'm even here."   Denies SI/HI. Pt guarded. Maintained on 15 minute checks and observation by security camera for safety.

## 2017-12-21 NOTE — ED Notes (Addendum)
Pt denies SI/HI/AVH.  Pt remains delusional stating, "There is earwax in my urine."  However she is not a danger to self or others.  Patient pleasant upon discharge.  Pt given discharge instructions. Pt states understanding. Pt states receipt of all belongings.   Signature pad not working for [employer].

## 2017-12-21 NOTE — ED Notes (Signed)
Patient resting quietly in room. No noted distress or abnormal behaviors noted. Will continue 15 minute checks and observation by security camera for safety. 

## 2017-12-21 NOTE — ED Provider Notes (Signed)
-----------------------------------------   8:03 AM on 12/21/2017 -----------------------------------------   Blood pressure 126/81, pulse 99, temperature 97.9 F (36.6 C), temperature source Oral, resp. rate 18, weight 72.6 kg (160 lb), last menstrual period 12/20/2017, SpO2 100 %.  The patient had no acute events since last update.  Calm and cooperative at this time.  Disposition is pending Psychiatry/Behavioral Medicine team recommendations.  The specialist on-call recommendation was inpatient admission.   Loney Hering, MD 12/21/17 [phone]

## 2017-12-21 NOTE — Consult Note (Signed)
[facility] Consult   Reason for Consult: Consult for 32 year old woman who presented to the emergency room last night requesting a urine analysis to help her with her argument that the government is broadcasting things into her ears Referring Physician: Joni Fears Patient Identification: Katherine Stephens MRN:  [id number] Principal Diagnosis: Schizophrenia [facility] Diagnosis:   Patient Active Problem List   Diagnosis Date Noted  . Schizophrenia ([place]) [F20.9] 12/21/2017  . Noncompliance [Z91.19] 12/21/2017  . 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 Auditory hallucinations [R44.0] 10/18/2017  . Poor insight into neurotic condition [F48.9] 10/18/2017  . ASCUS with positive high risk HPV cervical [R87.610, R87.810] 10/07/2017  . Hypertension [I10] 09/29/2017  . Tobacco use [Z72.0] 09/23/2017  . Anxiety attack [F41.0] 08/18/2017    Total Time spent with patient: 1 hour  Subjective:   Katherine Stephens is a 32 y.o. female patient admitted with "I do not know why I am here, I just needed a urine analysis".  HPI: Patient interviewed chart reviewed.  Patient known from previous encounters.  32 year old woman who came to the hospital with a physical complaint that seemed obviously to indicate psychosis.  On interview today the patient is happy to elaborate on this.  She tells me that she has been seeing chunks of earwax showing up in her urine and her menstrual flow.  She believes that earwax is moving from her ears into her veins and then into her menstrual flow and urine.  She believes this has something to do with the fact that some [employer] are running a "soundboard" which broadcasts voices and strange sounds into her ears.  Patient mentions several famous prominent people as well as generally saying that the CIA and FBI are all involved with this and that she feels that she will be vindicated soon.  Nevertheless she does not complain of any specific mood  symptoms.  Does not feel particularly depressed.  Does not feel particularly angry and does not come across as hospital.  Completely denies any thoughts of doing anything violent or aggressive.  Denies any suicidal thoughts.  She denies that she is using any drugs.  She is not taking any medication right now especially not the medicine and I had prescribed for her last time we saw her.  Social history: It is always hard to get a really straight answer out of her.  She tells me that she finished her graduate classes that she was taking before and is waiting for something to happen so that she can take some more.  Meanwhile she is living in a run down apartment.  She tells a story that suggests that there are eviction proceedings going on but when I directly asked her this she denied that she was being evicted.  Medical history: History of hypertension.  No other ongoing medical problems.  Substance abuse history: Denies use of alcohol or drugs no past history of substance abuse identified  Past Psychiatric History: We note that the patient has presented here to the emergency room at least once previously several months ago with very similar complaints.  At that time just like this time she was obviously psychotic with multiple delusions bizarre thinking and paranoia but just like this time there was no evidence that she was dangerous.  She had not done anything to harm herself, she was not threatening or agitated towards anyone.  She appeared to be in adequate health and seemed to be taking care of herself adequately.  Patient never  followed up with the psychiatric treatment I recommended to her last time.  Risk to Self: Suicidal Ideation: No Suicidal Intent: No Is patient at risk for suicide?: No Suicidal Plan?: No Access to Means: No What has been your use of drugs/alcohol within the last 12 months?: none reported Other Self Harm Risks: none identified Triggers for Past Attempts: None  known Intentional Self Injurious Behavior: None Risk to Others: Homicidal Ideation: No Thoughts of Harm to Others: No Current Homicidal Intent: No Current Homicidal Plan: No Access to Homicidal Means: No Identified Victim: none identified History of harm to others?: No Assessment of Violence: None Noted Does patient have access to weapons?: No Criminal Charges Pending?: No Does patient have a court date: No Prior Inpatient Therapy: Prior Inpatient Therapy: No Prior Therapy Dates: na Prior Therapy Facilty/Provider(s): na Reason for Treatment: na Prior Outpatient Therapy: Prior Outpatient Therapy: Yes Prior Therapy Dates: unknown Prior Therapy Facilty/Provider(s): unknown Reason for Treatment: unknown Does patient have an ACCT team?: Unknown Does patient have Intensive In-House Services?  : No Does patient have [facility] services? : Unknown Does patient have P4CC services?: Unknown  Past Medical History:  Past Medical History:  Diagnosis Date  . Hypertension     Past Surgical History:  Procedure Laterality Date  . NO PAST SURGERIES     Family History:  Family History  Problem Relation Age of Onset  . Hypertension Mother   . Hypertension Father   . Diabetes Maternal Grandmother    Family Psychiatric  History: Denies there being any Social History:  Social History   Substance and Sexual Activity  Alcohol Use Yes   Comment: 2 x week     Social History   Substance and Sexual Activity  Drug Use No    Social History   Socioeconomic History  . Marital status: Divorced    Spouse name: None  . Number of children: None  . Years of education: None  . Highest education level: None  Social Needs  . Financial resource strain: None  . Food insecurity - worry: None  . Food insecurity - inability: None  . Transportation needs - medical: None  . Transportation needs - non-medical: None  Occupational History  . None  Tobacco Use  . Smoking status: Current Some Day  Smoker    Types: Cigarettes  . Smokeless tobacco: Never Used  . Tobacco comment: smokes cigarettes occasionally. Started smoking at age 51  Substance and Sexual Activity  . Alcohol use: Yes    Comment: 2 x week  . Drug use: No  . Sexual activity: Not Currently  Other Topics Concern  . None  Social History Narrative  . None   Additional Social History:    Allergies:   Allergies  Allergen Reactions  . Amoxicillin   . Penicillins Hives    Has patient had a PCN reaction causing immediate rash, facial/tongue/throat swelling, SOB or lightheadedness with hypotension: Yes Has patient had a PCN reaction causing severe rash involving mucus membranes or skin necrosis: No Has patient had a PCN reaction that required hospitalization: No Has patient had a PCN reaction occurring within the last 10 years: No If all of the above answers are "NO", then may proceed with Cephalosporin use.   [place] [Pseudoephedrine Hcl]     Labs:  Results for orders placed or performed during the hospital encounter of 12/20/17 (from the past 48 hour(s))  CBC     Status: None   Collection Time: 12/20/17  7:10 PM  Result Value Ref Range   WBC 8.4 3.6 - 11.0 K/uL   RBC 4.90 3.80 - 5.20 MIL/uL   Hemoglobin 14.3 12.0 - 16.0 g/dL   HCT 41.8 35.0 - 47.0 %   MCV 85.3 80.0 - 100.0 fL   MCH 29.1 26.0 - 34.0 pg   MCHC 34.2 32.0 - 36.0 g/dL   RDW 13.1 11.5 - 14.5 %   Platelets 281 150 - 440 K/uL    Comment: Performed at [facility], [address] [place], [place] [address]  Comprehensive metabolic panel     Status: Abnormal   Collection Time: 12/20/17  7:10 PM  Result Value Ref Range   Sodium 139 135 - 145 mmol/L   Potassium 3.1 (L) 3.5 - 5.1 mmol/L   Chloride 107 101 - 111 mmol/L   CO2 24 22 - 32 mmol/L   Glucose, Bld 207 (H) 65 - 99 mg/dL   BUN 13 6 - 20 mg/dL   Creatinine, Ser 0.65 0.44 - 1.00 mg/dL   Calcium 9.1 8.9 - 10.3 mg/dL   Total Protein 7.1 6.5 - 8.1 g/dL   Albumin 4.3 3.5 - 5.0  g/dL   AST 24 15 - 41 U/L   ALT 17 14 - 54 U/L   Alkaline Phosphatase 37 (L) 38 - 126 U/L   Total Bilirubin 0.4 0.3 - 1.2 mg/dL   GFR calc non Af Amer >60 >60 mL/min   GFR calc Af Amer >60 >60 mL/min    Comment: (NOTE) The eGFR has been calculated using the CKD EPI equation. This calculation has not been validated in all clinical situations. eGFR's persistently <60 mL/min signify possible Chronic Kidney Disease.    Anion gap 8 5 - 15    Comment: Performed at [facility], [address] [place], [place] [address]  Ethanol     Status: None   Collection Time: 12/20/17  7:10 PM  Result Value Ref Range   Alcohol, Ethyl (B) <10 <10 mg/dL    Comment:        LOWEST DETECTABLE LIMIT FOR SERUM ALCOHOL IS 10 mg/dL FOR MEDICAL PURPOSES ONLY Performed at [facility], [address] [place], [place] [address]   hCG, quantitative, pregnancy     Status: None   Collection Time: 12/20/17  7:30 PM  Result Value Ref Range   hCG, Beta Chain, Quant, S 1 <5 mIU/mL    Comment:          GEST. AGE      CONC.  (mIU/mL)   <=1 WEEK        5 - 50     2 WEEKS       50 - 500     3 WEEKS       100 - 10,000     4 WEEKS     1,000 - 30,000     5 WEEKS     3,500 - 115,000   6-8 WEEKS     12,000 - 270,000    12 WEEKS     15,000 - 220,000        FEMALE AND NON-PREGNANT FEMALE:     LESS THAN 5 mIU/mL Performed at [facility], [address] [place], [place] [address]   Urinalysis, Complete w Microscopic     Status: Abnormal   Collection Time: 12/20/17  7:35 PM  Result Value Ref Range   Color, Urine COLORLESS (A) YELLOW   APPearance CLEAR (A) CLEAR  Specific Gravity, Urine 1.002 (L) 1.005 - 1.030   pH 6.0 5.0 - 8.0   Glucose, UA NEGATIVE NEGATIVE mg/dL   Hgb urine dipstick MODERATE (A) NEGATIVE   Bilirubin Urine NEGATIVE NEGATIVE   Ketones, ur NEGATIVE NEGATIVE mg/dL   Protein, ur NEGATIVE NEGATIVE mg/dL   Nitrite NEGATIVE NEGATIVE   Leukocytes, UA NEGATIVE NEGATIVE    RBC / HPF 0-5 0 - 5 RBC/hpf   WBC, UA 0-5 0 - 5 WBC/hpf   Bacteria, UA RARE (A) NONE SEEN   Squamous Epithelial / LPF 0-5 (A) NONE SEEN    Comment: Performed at [facility], [address], [place], [place] [address]  Urine Drug Screen, Qualitative ([facility] only)     Status: None   Collection Time: 12/20/17  7:35 PM  Result Value Ref Range   Tricyclic, Ur Screen NONE DETECTED NONE DETECTED   Amphetamines, Ur Screen NONE DETECTED NONE DETECTED   MDMA (Ecstasy)Ur Screen NONE DETECTED NONE DETECTED   Cocaine Metabolite,Ur [place] NONE DETECTED NONE DETECTED   Opiate, Ur Screen NONE DETECTED NONE DETECTED   Phencyclidine (PCP) Ur S NONE DETECTED NONE DETECTED   Cannabinoid 50 Ng, Ur [place] NONE DETECTED NONE DETECTED   Barbiturates, Ur Screen NONE DETECTED NONE DETECTED   Benzodiazepine, Ur Scrn NONE DETECTED NONE DETECTED   Methadone Scn, Ur NONE DETECTED NONE DETECTED    Comment: (NOTE) Tricyclics + metabolites, urine    Cutoff 1000 ng/mL Amphetamines + metabolites, urine  Cutoff 1000 ng/mL MDMA (Ecstasy), urine              Cutoff 500 ng/mL Cocaine Metabolite, urine          Cutoff 300 ng/mL Opiate + metabolites, urine        Cutoff 300 ng/mL Phencyclidine (PCP), urine         Cutoff 25 ng/mL Cannabinoid, urine                 Cutoff 50 ng/mL Barbiturates + metabolites, urine  Cutoff 200 ng/mL Benzodiazepine, urine              Cutoff 200 ng/mL Methadone, urine                   Cutoff 300 ng/mL The urine drug screen provides only a preliminary, unconfirmed analytical test result and should not be used for non-medical purposes. Clinical consideration and professional judgment should be applied to any positive drug screen result due to possible interfering substances. A more specific alternate chemical method must be used in order to obtain a confirmed analytical result. Gas chromatography / mass spectrometry (GC/MS) is the preferred confirmat ory method. Performed at [facility], [address] [place], [place] [address]     No current facility-administered medications for this encounter.    Current Outpatient Medications  Medication Sig Dispense Refill  . escitalopram (LEXAPRO) 10 MG tablet Take 1 tablet (10 mg total) by mouth daily. (Patient not taking: Reported on 09/29/2017) 30 tablet 2  . risperiDONE (RISPERDAL) 1 MG tablet Take 1 tablet (1 mg total) by mouth 2 (two) times daily. 60 tablet 1    Musculoskeletal: Strength & Muscle Tone: within normal limits Gait & Station: normal Patient leans: N/A  Psychiatric Specialty Exam: Physical Exam  Nursing note and vitals reviewed. Constitutional: She appears well-developed and well-nourished.  HENT:  Head: Normocephalic and atraumatic.  Eyes: Conjunctivae are normal. Pupils are equal, round, and reactive to light.  Neck: Normal range of  motion.  Cardiovascular: Regular rhythm and normal heart sounds.  Respiratory: Effort normal. No respiratory distress.  GI: Soft.  Musculoskeletal: Normal range of motion.  Neurological: She is alert.  Skin: Skin is warm and dry.  Psychiatric: Her affect is blunt. Her speech is tangential. She is withdrawn. Thought content is paranoid and delusional. Cognition and memory are impaired. She expresses impulsivity and inappropriate judgment. She expresses no homicidal and no suicidal ideation.    Review of Systems  Constitutional: Negative.   HENT: Negative.   Eyes: Negative.   Respiratory: Negative.   Cardiovascular: Negative.   Gastrointestinal: Negative.   Musculoskeletal: Negative.   Skin: Negative.   Neurological: Negative.   Psychiatric/Behavioral: Positive for hallucinations. Negative for depression, memory loss, substance abuse and suicidal ideas. The patient is not nervous/anxious and does not have insomnia.     Blood pressure 126/81, pulse 99, temperature 97.9 F (36.6 C), temperature source Oral, resp. rate 18, weight 160 lb (72.6 kg), last  menstrual period 12/20/2017, SpO2 100 %.Body mass index is 25.06 kg/m.  General Appearance: Disheveled  Eye Contact:  Fair  Speech:  Slow  Volume:  Decreased  Mood:  Euthymic  Affect:  Constricted  Thought Process:  Disorganized  Orientation:  Full (Time, Place, and Person)  Thought Content:  Illogical, Delusions, Hallucinations: Auditory and Paranoid Ideation  Suicidal Thoughts:  No  Homicidal Thoughts:  No  Memory:  Immediate;   Fair Recent;   Fair Remote;   Fair  Judgement:  Fair  Insight:  Fair  Psychomotor Activity:  Decreased  Concentration:  Concentration: Fair  Recall:  [employer] of Knowledge:  Fair  Language:  Fair  Akathisia:  No  Handed:  Right  AIMS (if indicated):     Assets:  Housing Physical Health Resilience  ADL's:  Intact  Cognition:  WNL  Sleep:        Treatment Plan Summary: Plan 32 year old woman who presents once again with psychotic symptoms.  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  I spent some time attempting to educate her about this and to encourage her to allow me to voluntarily admit her to the hospital.  I told her that without hospitalization and medication management her symptoms were likely to get worse with more and more bad outcomes.  Of course she refused to believe me about any of this.  At this point I do not think she meets criteria of dangerousness and I do not think she legally meets the criteria that would allow for commitment to the hospital.  I am not going to give her another prescription as she refused to fill the last one.  Patient is strongly encouraged to go for outpatient psychiatric treatment in the community.  Case reviewed with TTS and emergency room physician.  Disposition: No evidence of imminent  risk to self or others at present.   Supportive therapy provided about ongoing stressors.  Alethia Berthold, MD 12/21/2017 5:22 PM

## 2018-01-03 ENCOUNTER — Emergency Department
Admission: EM | Admit: 2018-01-03 | Discharge: 2018-01-04 | Disposition: A | Discharge status: Other Acute Inpatient Hospital | Payer: Self-pay | Attending: Emergency Medicine | Admitting: Emergency Medicine

## 2018-01-03 ENCOUNTER — Encounter: Payer: Self-pay | Admitting: Medical Oncology

## 2018-01-03 DIAGNOSIS — Z9119 Patient's noncompliance with other medical treatment and regimen: Secondary | ICD-10-CM

## 2018-01-03 DIAGNOSIS — F312 Bipolar disorder, current episode manic severe with psychotic features: Secondary | ICD-10-CM | POA: Diagnosis present

## 2018-01-03 DIAGNOSIS — F17200 Nicotine dependence, unspecified, uncomplicated: Secondary | ICD-10-CM | POA: Insufficient documentation

## 2018-01-03 DIAGNOSIS — Z91199 Patient's noncompliance with other medical treatment and regimen due to unspecified reason: Secondary | ICD-10-CM

## 2018-01-03 DIAGNOSIS — I10 Essential (primary) hypertension: Secondary | ICD-10-CM | POA: Diagnosis present

## 2018-01-03 DIAGNOSIS — Z9114 Patient's other noncompliance with medication regimen: Secondary | ICD-10-CM | POA: Insufficient documentation

## 2018-01-03 DIAGNOSIS — F29 Unspecified psychosis not due to a substance or known physiological condition: Secondary | ICD-10-CM | POA: Insufficient documentation

## 2018-01-03 DIAGNOSIS — Z590 Homelessness: Secondary | ICD-10-CM | POA: Insufficient documentation

## 2018-01-03 DIAGNOSIS — F203 Undifferentiated schizophrenia: Secondary | ICD-10-CM

## 2018-01-03 DIAGNOSIS — Z79899 Other long term (current) drug therapy: Secondary | ICD-10-CM | POA: Insufficient documentation

## 2018-01-03 HISTORY — DX: Schizoaffective disorder, bipolar type: F25.0

## 2018-01-03 HISTORY — DX: Schizoaffective disorder, unspecified: F25.9

## 2018-01-03 LAB — URINE DRUG SCREEN, QUALITATIVE (ARMC ONLY)
Amphetamines, Ur Screen: NOT DETECTED
BARBITURATES, UR SCREEN: NOT DETECTED
BENZODIAZEPINE, UR SCRN: NOT DETECTED
CANNABINOID 50 NG, UR ~~LOC~~: NOT DETECTED
Cocaine Metabolite,Ur ~~LOC~~: NOT DETECTED
MDMA (Ecstasy)Ur Screen: NOT DETECTED
Methadone Scn, Ur: NOT DETECTED
OPIATE, UR SCREEN: NOT DETECTED
Phencyclidine (PCP) Ur S: NOT DETECTED
Tricyclic, Ur Screen: NOT DETECTED

## 2018-01-03 LAB — CBC
HCT: 44.8 % (ref 35.0–47.0)
HEMOGLOBIN: 15.2 g/dL (ref 12.0–16.0)
MCH: 29.3 pg (ref 26.0–34.0)
MCHC: 34 g/dL (ref 32.0–36.0)
MCV: 86.1 fL (ref 80.0–100.0)
Platelets: 303 10*3/uL (ref 150–440)
RBC: 5.2 MIL/uL (ref 3.80–5.20)
RDW: 13.2 % (ref 11.5–14.5)
WBC: 9 10*3/uL (ref 3.6–11.0)

## 2018-01-03 LAB — COMPREHENSIVE METABOLIC PANEL
ALK PHOS: 45 U/L (ref 38–126)
ALT: 25 U/L (ref 14–54)
ANION GAP: 8 (ref 5–15)
AST: 25 U/L (ref 15–41)
Albumin: 4.9 g/dL (ref 3.5–5.0)
BILIRUBIN TOTAL: 0.8 mg/dL (ref 0.3–1.2)
BUN: 12 mg/dL (ref 6–20)
CALCIUM: 9.2 mg/dL (ref 8.9–10.3)
CO2: 27 mmol/L (ref 22–32)
CREATININE: 0.72 mg/dL (ref 0.44–1.00)
Chloride: 104 mmol/L (ref 101–111)
GFR calc non Af Amer: >60 mL/min (ref >60)
Glucose, Bld: 81 mg/dL (ref 65–99)
Potassium: 3.5 mmol/L (ref 3.5–5.1)
SODIUM: 139 mmol/L (ref 135–145)
TOTAL PROTEIN: 8.3 g/dL — AB (ref 6.5–8.1)

## 2018-01-03 LAB — ACETAMINOPHEN LEVEL: Acetaminophen (Tylenol), Serum: <10 ug/mL — ABNORMAL LOW (ref 10–30)

## 2018-01-03 LAB — ETHANOL: Alcohol, Ethyl (B): <10 mg/dL (ref <10)

## 2018-01-03 LAB — SALICYLATE LEVEL

## 2018-01-03 MED ORDER — RISPERIDONE 1 MG PO TBDP
1.0000 mg | ORAL_TABLET | Freq: Every day | ORAL | Status: DC
  Administered 2018-01-03: 1 mg via ORAL
  Filled 2018-01-03: qty 1

## 2018-01-03 NOTE — ED Notes (Signed)
MD at bedside. 
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
## 2018-01-03 NOTE — ED Notes (Signed)
IVC 

## 2018-01-03 NOTE — ED Triage Notes (Addendum)
Pt here with 2 friends that reports pt has stopped taking her psych meds and has been talking very paranoid. She told her friends that she was being audited by the government and that a hole was drilled in her head and she was bugged by the government. Pt has been sleeping in her car, states that she was living in an apartment that she got kicked out of. Pt cooperative in triage. Denies SI/HI.

## 2018-01-03 NOTE — ED Notes (Signed)
Report given to [facility] in the BMU. Patient to be transferred after medication administration.

## 2018-01-03 NOTE — ED Notes (Signed)
Sitter at bedside.
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
## 2018-01-03 NOTE — BH Assessment (Signed)
Patient is to be admitted to [facility] by Dr. Weber Cooks Attending Physician will be Dr. Bary Leriche.   Patient has been assigned to room 315 by [place] [place] Nurse Abbi.

## 2018-01-03 NOTE — ED Notes (Signed)
Agricultural consultant, Actuary, and security made aware of pt's involuntary status

## 2018-01-03 NOTE — ED Notes (Signed)
Pt cooperative with transfer to [facility] Room 5 from ED. Pt oriented to room/unit/cameras/call light. Provided urine specimen for labwork. Pt reports she was brought to hospital by 2 of her friends after they had lunch today at [employer] because "they're worried about me." Pt would not elaborate on why they're worried. Pt appears guarded/paranoid, but is cooperative. Pt voices concern about admission due to "classes started yesterday at [facility], and I need to register by Friday." Pt currently watchign TV in her room. Safety maintained with every 15 minute checks and security cameras in place. Will continue to monitor.

## 2018-01-03 NOTE — ED Notes (Signed)
Patient calm and cooperative.  Affect is pleasant.  Patient oriented X4.  No distress noted.  Q15 min checks remain to keep patient safe.

## 2018-01-03 NOTE — ED Notes (Signed)
Pt states there are "sound boards" in her room that have been illegally established and are currently being used to monitor her. Pt also states she has been illegally monitored and believes it started from "divorce papers," in [employer].

## 2018-01-03 NOTE — Consult Note (Signed)
[place] Psychiatry Consult   Reason for Consult: Consult for 32 year old woman who was brought by her friends to the emergency room because of concerns about her mental health Referring Physician: Rip Harbour Patient Identification: Katherine Stephens MRN:  [id number] Principal Diagnosis: Schizophrenia [facility]) Diagnosis:   Patient Active Problem List   Diagnosis Date Noted  . Schizophrenia ([place]) [F20.9] 12/21/2017    Priority: High  . Noncompliance [Z91.19] 12/21/2017    Priority: High  . Hypertension [I10] 09/29/2017    Priority: Medium  . Schizophreniform disorder ([place]) [F20.81] 10/18/2017  . Auditory hallucinations [R44.0] 10/18/2017  . Poor insight into neurotic condition [F48.9] 10/18/2017  . ASCUS with positive high risk HPV cervical [R87.610, R87.810] 10/07/2017  . Tobacco use [Z72.0] 09/23/2017  . Anxiety attack [F41.0] 08/18/2017    Total Time spent with patient: 1 hour  Subjective:   Katherine Stephens is a 32 y.o. female patient admitted with "my friends were worried about me".  HPI: Patient interviewed chart reviewed.  Old notes reviewed such as they are.  This is a 32 year old woman who was brought to the emergency room by a couple of friends of hers who are concerned about her mental health.  The patient presented as awake alert and basically cooperative although her insight is very poor.  She tells me that she recently was evicted from her apartment where she had been living with no utilities turned on.  Her friends are also concerned because she has no license plate on her car.  They have arranged to have an apartment for her staying with a pastor of a church.  Patient admits that she has no income.  Her ideas about how she will take care of herself going forward are completely unrealistic.  She admits that she hears things but she excuses them as being the natural results of having friends and family who work in the [employer].  She says her mood is fine.  Denies any medical  complaints.  Denies any recent substance abuse.  Denies any thoughts of hurting herself.  Social history: Patient apparently is now homeless which has brought her to the attention of some friends who will become alarmed with her.  Patient always talks about how she will be going back to school to complete graduate school any time but there is no indication she has done anything about that.  Medical history: History of hypertension.  No other significant ongoing medical issues besides the mental health.  Substance abuse history: Denies any alcohol or recent drug abuse.  No evidence of regular substance abuse problems  Past Psychiatric History: This is, I believe, the third time I have seen this patient in the emergency room under similar circumstances.  It is clear that this woman is having psychotic symptoms with hallucinations delusions disorganized thinking very poor self-care.  The previous 2 times I saw her she was not under involuntary commitment and did not appear to be acutely dangerous.  On those occasions she was released from the emergency room after refusing offers of voluntary admission.  Has apparently never followed up with outpatient psychiatric treatment.  Risk to Self: Is patient at risk for suicide?: No Risk to Others:   Prior Inpatient Therapy:   Prior Outpatient Therapy:    Past Medical History:  Past Medical History:  Diagnosis Date  . Hypertension   . Schizo affective schizophrenia [facility])     Past Surgical History:  Procedure Laterality Date  . NO PAST SURGERIES     Family History:  Family History  Problem Relation Age of Onset  . Hypertension Mother   . Hypertension Father   . Diabetes Maternal Grandmother    Family Psychiatric  History: She denies any Social History:  Social History   Substance and Sexual Activity  Alcohol Use Yes   Comment: 2 x week     Social History   Substance and Sexual Activity  Drug Use No    Social History   Socioeconomic  History  . Marital status: Divorced    Spouse name: None  . Number of children: None  . Years of education: None  . Highest education level: None  Social Needs  . Financial resource strain: None  . Food insecurity - worry: None  . Food insecurity - inability: None  . Transportation needs - medical: None  . Transportation needs - non-medical: None  Occupational History  . None  Tobacco Use  . Smoking status: Current Some Day Smoker    Types: Cigarettes  . Smokeless tobacco: Never Used  . Tobacco comment: smokes cigarettes occasionally. Started smoking at age 75  Substance and Sexual Activity  . Alcohol use: Yes    Comment: 2 x week  . Drug use: No  . Sexual activity: Not Currently  Other Topics Concern  . None  Social History Narrative  . None   Additional Social History:    Allergies:   Allergies  Allergen Reactions  . Amoxicillin   . Penicillins Hives    Has patient had a PCN reaction causing immediate rash, facial/tongue/throat swelling, SOB or lightheadedness with hypotension: Yes Has patient had a PCN reaction causing severe rash involving mucus membranes or skin necrosis: No Has patient had a PCN reaction that required hospitalization: No Has patient had a PCN reaction occurring within the last 10 years: No If all of the above answers are "NO", then may proceed with Cephalosporin use.   [place] [Pseudoephedrine Hcl]     Labs:  Results for orders placed or performed during the hospital encounter of 01/03/18 (from the past 48 hour(s))  Comprehensive metabolic panel     Status: Abnormal   Collection Time: 01/03/18  2:30 PM  Result Value Ref Range   Sodium 139 135 - 145 mmol/L   Potassium 3.5 3.5 - 5.1 mmol/L   Chloride 104 101 - 111 mmol/L   CO2 27 22 - 32 mmol/L   Glucose, Bld 81 65 - 99 mg/dL   BUN 12 6 - 20 mg/dL   Creatinine, Ser 0.72 0.44 - 1.00 mg/dL   Calcium 9.2 8.9 - 10.3 mg/dL   Total Protein 8.3 (H) 6.5 - 8.1 g/dL   Albumin 4.9 3.5 - 5.0 g/dL    AST 25 15 - 41 U/L   ALT 25 14 - 54 U/L   Alkaline Phosphatase 45 38 - 126 U/L   Total Bilirubin 0.8 0.3 - 1.2 mg/dL   GFR calc non Af Amer >60 >60 mL/min   GFR calc Af Amer >60 >60 mL/min    Comment: (NOTE) The eGFR has been calculated using the CKD EPI equation. This calculation has not been validated in all clinical situations. eGFR's persistently <60 mL/min signify possible Chronic Kidney Disease.    Anion gap 8 5 - 15    Comment: Performed at [facility], [address] [place],  [address]  Ethanol     Status: None   Collection Time: 01/03/18  2:30 PM  Result Value Ref Range   Alcohol, Ethyl (B) <10 <  10 mg/dL    Comment:        LOWEST DETECTABLE LIMIT FOR SERUM ALCOHOL IS 10 mg/dL FOR MEDICAL PURPOSES ONLY Performed at [facility], [address] [place], [place] [address]   Salicylate level     Status: None   Collection Time: 01/03/18  2:30 PM  Result Value Ref Range   Salicylate Lvl <7.5 2.8 - 30.0 mg/dL    Comment: Performed at [facility], [address] [place], [place] [address]  Acetaminophen level     Status: Abnormal   Collection Time: 01/03/18  2:30 PM  Result Value Ref Range   Acetaminophen (Tylenol), Serum <10 (L) 10 - 30 ug/mL    Comment:        THERAPEUTIC CONCENTRATIONS VARY SIGNIFICANTLY. A RANGE OF 10-30 ug/mL MAY BE AN EFFECTIVE CONCENTRATION FOR MANY PATIENTS. HOWEVER, SOME ARE BEST TREATED AT CONCENTRATIONS OUTSIDE THIS RANGE. ACETAMINOPHEN CONCENTRATIONS >150 ug/mL AT 4 HOURS AFTER INGESTION AND >50 ug/mL AT 12 HOURS AFTER INGESTION ARE OFTEN ASSOCIATED WITH TOXIC REACTIONS. Performed at [facility], [address] [place], [place] [address]   cbc     Status: None   Collection Time: 01/03/18  2:30 PM  Result Value Ref Range   WBC 9.0 3.6 - 11.0 K/uL   RBC 5.20 3.80 - 5.20 MIL/uL   Hemoglobin 15.2 12.0 - 16.0 g/dL   HCT 44.8 35.0 - 47.0 %   MCV 86.1 80.0 - 100.0 fL   MCH 29.3  26.0 - 34.0 pg   MCHC 34.0 32.0 - 36.0 g/dL   RDW 13.2 11.5 - 14.5 %   Platelets 303 150 - 440 K/uL    Comment: Performed at [facility], [address] [place], [place] [address]    No current facility-administered medications for this encounter.    Current Outpatient Medications  Medication Sig Dispense Refill  . risperiDONE (RISPERDAL) 1 MG tablet Take 1 tablet (1 mg total) by mouth 2 (two) times daily. 60 tablet 1    Musculoskeletal: Strength & Muscle Tone: within normal limits Gait & Station: normal Patient leans: N/A  Psychiatric Specialty Exam: Physical Exam  Nursing note and vitals reviewed. Constitutional: She appears well-developed and well-nourished.  HENT:  Head: Normocephalic and atraumatic.  Eyes: Conjunctivae are normal. Pupils are equal, round, and reactive to light.  Neck: Normal range of motion.  Cardiovascular: Regular rhythm and normal heart sounds.  Respiratory: Effort normal. No respiratory distress.  GI: Soft.  Musculoskeletal: Normal range of motion.  Neurological: She is alert.  Skin: Skin is warm and dry.  Psychiatric: Her affect is blunt. Her speech is delayed and tangential. She is not agitated and not aggressive. Thought content is paranoid and delusional. Cognition and memory are impaired. She expresses impulsivity and inappropriate judgment. She expresses no homicidal and no suicidal ideation. She is inattentive.    Review of Systems  Constitutional: Negative.   HENT: Negative.   Eyes: Negative.   Respiratory: Negative.   Cardiovascular: Negative.   Gastrointestinal: Negative.   Musculoskeletal: Negative.   Skin: Negative.   Neurological: Negative.   Psychiatric/Behavioral: Positive for hallucinations. Negative for depression, memory loss, substance abuse and suicidal ideas. The patient is not nervous/anxious and does not have insomnia.     Blood pressure (!) 157/89, pulse 91, temperature 98.1 F (36.7 C), temperature source  Oral, resp. rate 16, height 5' 7"  (1.702 m), weight 65.8 kg (145 lb), last menstrual period 12/20/2017, SpO2 99 %.Body mass index is  22.71 kg/m.  General Appearance: Disheveled  Eye Contact:  Fair  Speech:  Slow  Volume:  Decreased  Mood:  Anxious  Affect:  Flat  Thought Process:  Disorganized and Irrelevant  Orientation:  Full (Time, Place, and Person)  Thought Content:  Illogical, Paranoid Ideation, Rumination and Tangential  Suicidal Thoughts:  No  Homicidal Thoughts:  No  Memory:  Immediate;   Fair Recent;   Fair Remote;   Fair  Judgement:  Poor  Insight:  Lacking  Psychomotor Activity:  Normal  Concentration:  Concentration: Poor  Recall:  [place] of Knowledge:  Fair  Language:  Good  Akathisia:  No  Handed:  Right  AIMS (if indicated):     Assets:  Armed forces logistics/support/administrative officer Social Support  ADL's:  Impaired  Cognition:  Impaired,  Mild  Sleep:        Treatment Plan Summary: Daily contact with patient to assess and evaluate symptoms and progress in treatment, Medication management and Plan 32 year old woman who evidently has been having psychotic symptoms now for many months possibly longer but who has resisted and avoided getting any treatment.  Once again she presents paranoid disorganized clearly having hallucinations.  Failing to take care of her basic needs now and with no insight into the problems she is facing.  The emergency room doctor has filed commitment papers on this patient at his own discretion and a custody order has been issued.  Under those circumstances I believe it is best to admit her to the psychiatric ward.  Orders done for admission.  I am recommending starting antipsychotic medication.  Full set of labs can be done.  15-minute checks and full assessment downstairs.  Disposition: Recommend psychiatric Inpatient admission when medically cleared.  Alethia Berthold, MD 01/03/2018 5:33 PM

## 2018-01-03 NOTE — ED Notes (Signed)
Pt given cup of water. No other needs expressed.

## 2018-01-03 NOTE — ED Notes (Addendum)
Dr. Cinda Quest aware of pt need for evaluation.  Friend remains at bedside with pt.  Pt cooperative at this time.  Moved pt from hallway to room 24 for closer evaluation and sitter placement.  Sitter now at bedside.

## 2018-01-03 NOTE — ED Notes (Signed)
Pt sitting in hallway with friend.  Pt in purple scrubs and belongings are secured at this time.  Will move to room 24 as soon as available.  Pt being monitored by this RN until transfer to 24 occurs.

## 2018-01-03 NOTE — ED Notes (Signed)
Pt was changed into hospital approved attire and clothing bagged. Pts apple watch, two white in colored ball earrings and silver in color ring was taken and placed inside of bag also.

## 2018-01-03 NOTE — ED Notes (Signed)
Pt IVC/seen by Dr Clapacs/recommends placement when medically cleared.

## 2018-01-03 NOTE — ED Notes (Signed)
Dr.Clapacs at bedside  

## 2018-01-04 ENCOUNTER — Inpatient Hospital Stay
Admission: AD | Admit: 2018-01-04 | Discharge: 2018-01-13 | DRG: 885 | Disposition: A | Discharge status: Home / Self Care | Payer: No Typology Code available for payment source | Attending: Psychiatry | Admitting: Psychiatry

## 2018-01-04 ENCOUNTER — Other Ambulatory Visit: Payer: Self-pay

## 2018-01-04 DIAGNOSIS — F17200 Nicotine dependence, unspecified, uncomplicated: Secondary | ICD-10-CM | POA: Diagnosis present

## 2018-01-04 DIAGNOSIS — F203 Undifferentiated schizophrenia: Secondary | ICD-10-CM | POA: Diagnosis not present

## 2018-01-04 DIAGNOSIS — Z590 Homelessness: Secondary | ICD-10-CM

## 2018-01-04 DIAGNOSIS — Z9119 Patient's noncompliance with other medical treatment and regimen: Secondary | ICD-10-CM

## 2018-01-04 DIAGNOSIS — Z560 Unemployment, unspecified: Secondary | ICD-10-CM

## 2018-01-04 DIAGNOSIS — I10 Essential (primary) hypertension: Secondary | ICD-10-CM | POA: Diagnosis present

## 2018-01-04 DIAGNOSIS — F17210 Nicotine dependence, cigarettes, uncomplicated: Secondary | ICD-10-CM | POA: Diagnosis present

## 2018-01-04 DIAGNOSIS — F312 Bipolar disorder, current episode manic severe with psychotic features: Principal | ICD-10-CM | POA: Diagnosis present

## 2018-01-04 LAB — LIPID PANEL
CHOL/HDL RATIO: 3.4 ratio
Cholesterol: 178 mg/dL (ref 0–200)
HDL: 53 mg/dL (ref >40)
LDL Cholesterol: 109 mg/dL — ABNORMAL HIGH (ref 0–99)
Triglycerides: 79 mg/dL (ref <150)
VLDL: 16 mg/dL (ref 0–40)

## 2018-01-04 LAB — TSH: TSH: 2.804 u[IU]/mL (ref 0.350–4.500)

## 2018-01-04 LAB — HEMOGLOBIN A1C
Hgb A1c MFr Bld: 5.1 % (ref 4.8–5.6)
MEAN PLASMA GLUCOSE: 99.67 mg/dL

## 2018-01-04 LAB — HCG, QUANTITATIVE, PREGNANCY

## 2018-01-04 MED ORDER — MAGNESIUM HYDROXIDE 400 MG/5ML PO SUSP: 30.0000 mL | Freq: Every day | ORAL | Status: DC | PRN

## 2018-01-04 MED ORDER — HYDROXYZINE HCL 25 MG PO TABS
25.0000 mg | ORAL_TABLET | Freq: Three times a day (TID) | ORAL | Status: DC | PRN
  Filled 2018-01-04: qty 1

## 2018-01-04 MED ORDER — RISPERIDONE 1 MG PO TBDP
2.0000 mg | ORAL_TABLET | Freq: Two times a day (BID) | ORAL | Status: DC
  Administered 2018-01-04 – 2018-01-05 (×2): 2 mg via ORAL
  Filled 2018-01-04 (×2): qty 2
  Filled 2018-01-04: qty 1

## 2018-01-04 MED ORDER — ALUM & MAG HYDROXIDE-SIMETH 200-200-20 MG/5ML PO SUSP
30.0000 mL | ORAL | Status: DC | PRN
Start: 2018-01-04 — End: 2018-01-13

## 2018-01-04 MED ORDER — TRAZODONE HCL 100 MG PO TABS: 100.0000 mg | ORAL_TABLET | Freq: Every evening | ORAL | Status: DC | PRN

## 2018-01-04 MED ORDER — RISPERIDONE 1 MG PO TBDP
1.0000 mg | ORAL_TABLET | Freq: Every day | ORAL | Status: DC
  Filled 2018-01-04: qty 1

## 2018-01-04 MED ORDER — ACETAMINOPHEN 325 MG PO TABS
650.0000 mg | ORAL_TABLET | Freq: Four times a day (QID) | ORAL | Status: DC | PRN
  Administered 2018-01-06 – 2018-01-09 (×7): 650 mg via ORAL
  Filled 2018-01-04 (×7): qty 2

## 2018-01-04 MED ORDER — TEMAZEPAM 15 MG PO CAPS
15.0000 mg | ORAL_CAPSULE | Freq: Every day | ORAL | Status: DC
  Filled 2018-01-04: qty 1

## 2018-01-04 NOTE — Plan of Care (Signed)
Patient is alert and oriented x 4. Informs nurse that she came to the hospital because "life" happened to her. Patient focused on being discharged  so that she can go back to school to obtain her Master's Degree. Patient is compliant with unit's guidelines. Attends group with active participation, otherwise patient is isolative to her room reading her book. Denies SI/HI/AVH at this time. Milieu remains safe with q 15 minute safety checks.

## 2018-01-04 NOTE — [facility] Suicide Risk Assessment (Signed)
[facility] Admission Suicide Risk Assessment   Nursing information obtained from:  Patient Demographic factors:  Low socioeconomic status, Living alone Current Mental Status:  NA Loss Factors:  Decrease in vocational status, Financial problems / change in socioeconomic status Historical Factors:  NA Risk Reduction Factors:  Religious beliefs about death, Positive social support  Total Time spent with patient: 1 hour Principal Problem: Undifferentiated schizophrenia ([place]) Diagnosis:   Patient Active Problem List   Diagnosis Date Noted  . Undifferentiated schizophrenia ([place]) [F20.3] 12/21/2017    Priority: High  . Noncompliance [Z91.19] 12/21/2017  . Schizophreniform disorder ([place]) [F20.81] 10/18/2017  . Auditory hallucinations [R44.0] 10/18/2017  . Poor insight into neurotic condition [F48.9] 10/18/2017  . ASCUS with positive high risk HPV cervical [R87.610, R87.810] 10/07/2017  . Hypertension [I10] 09/29/2017  . Tobacco use disorder [F17.200] 09/23/2017  . Anxiety attack [F41.0] 08/18/2017   Subjective Data: psychotic break  Continued Clinical Symptoms:  Alcohol Use Disorder Identification Test Final Score (AUDIT): 1 The "Alcohol Use Disorders Identification Test", Guidelines for Use in Primary Care, Second Edition.  World Pharmacologist [facility]). Score between 0-7:  no or low risk or alcohol related problems. Score between 8-15:  moderate risk of alcohol related problems. Score between 16-19:  high risk of alcohol related problems. Score 20 or above:  warrants further diagnostic evaluation for alcohol dependence and treatment.   CLINICAL FACTORS:   Schizophrenia:   Less than 76 years old Paranoid or undifferentiated type   Musculoskeletal: Strength & Muscle Tone: within normal limits Gait & Station: normal Patient leans: N/A  Psychiatric Specialty Exam: Physical Exam  Nursing note and vitals reviewed. Psychiatric: Her speech is normal. Judgment normal. Her affect is  inappropriate. She is actively hallucinating. Thought content is paranoid and delusional. Cognition and memory are normal.    Review of Systems  Neurological: Negative.   Psychiatric/Behavioral: Positive for hallucinations. The patient has insomnia.   All other systems reviewed and are negative.   Blood pressure 107/78, pulse (!) 115, temperature 98.1 F (36.7 C), temperature source Oral, resp. rate 18, height 5\' 7"  (1.702 m), weight 67.1 kg (148 lb), last menstrual period 12/08/2017, SpO2 94 %.Body mass index is 23.18 kg/m.  General Appearance: Casual  Eye Contact:  Fair  Speech:  Clear and Coherent  Volume:  Normal  Mood:  Dysphoric  Affect:  odd  Thought Process:  Disorganized and Descriptions of Associations: Loose  Orientation:  Full (Time, Place, and Person)  Thought Content:  Delusions, Hallucinations: Auditory and Paranoid Ideation  Suicidal Thoughts:  No  Homicidal Thoughts:  No  Memory:  Immediate;   Fair Recent;   Fair Remote;   Fair  Judgement:  Poor  Insight:  Lacking  Psychomotor Activity:  Normal  Concentration:  Concentration: Fair and Attention Span: Fair  Recall:  [employer] of Knowledge:  Fair  Language:  Fair  Akathisia:  No  Handed:  Right  AIMS (if indicated):     Assets:  Communication Skills Desire for Improvement Physical Health Resilience Social Support  ADL's:  Intact  Cognition:  WNL  Sleep:  Number of Hours: 3.5      COGNITIVE FEATURES THAT CONTRIBUTE TO RISK:  None    SUICIDE RISK:   Moderate:  Frequent suicidal ideation with limited intensity, and duration, some specificity in terms of plans, no associated intent, good self-control, limited dysphoria/symptomatology, some risk factors present, and identifiable protective factors, including available and accessible social support.  PLAN OF CARE: hospital admission, medication  management, discharge planning  Katherine Stephens is 32 year old female with no past psychiatric history admitted for  psychotic break and deteriorating social functioning.  #Psychosis -start Risperdal 2 mg BID -start Restoril 30 mg nightly  #Metabolic syndrome monitoring -Lipid panel, TSH, HgbA1C are pending EKG, pending -pregnancy test is negative  #Disposition -discharge to her pastor;s place -follow up with [facility]  I certify that inpatient services furnished can reasonably be expected to improve the patient's condition.   Orson Slick, MD 01/04/2018, 1:32 PM

## 2018-01-04 NOTE — Progress Notes (Signed)
  Patient is alert and oriented x 4. Informs nurse that she came to the hospital because "life" happened to her. Patient focused on being discharged  so that she can go back to school to obtain her Master's Degree. Patient is compliant with unit's guidelines. Attends group with active participation, otherwise patient is isolative to her room reading her book. Denies SI/HI/AVH at this time. Milieu remains safe with q 15 minute safety checks. Will continue to monitor.

## 2018-01-04 NOTE — Progress Notes (Signed)
D: Pt denies SI/HI/AVH. Pt is pleasant and cooperative. Pt stated she was really tired, pt stayed in her room majority of the evening. Pt stated she wanted to get back to graduate school , pt endorsed no other complaints.   A: Pt was offered support and encouragement. Pt was given scheduled medications. Pt was encourage to attend groups. Q 15 minute checks were done for safety.   R: safety maintained on unit.

## 2018-01-04 NOTE — B[facility] Group Notes (Signed)
01/04/2018 9:30AM  Type of Therapy/Topic:  Group Therapy:  Emotion Regulation  Participation Level:  Did Not Attend   Description of Group:   The purpose of this group is to assist patients in learning to regulate negative emotions and experience positive emotions. Patients will be guided to discuss ways in which they have been vulnerable to their negative emotions. These vulnerabilities will be juxtaposed with experiences of positive emotions or situations, and patients will be challenged to use positive emotions to combat negative ones. Special emphasis will be placed on coping with negative emotions in conflict situations, and patients will process healthy conflict resolution skills.  Therapeutic Goals: 1. Patient will identify two positive emotions or experiences to reflect on in order to balance out negative emotions 2. Patient will label two or more emotions that they find the most difficult to experience 3. Patient will demonstrate positive conflict resolution skills through discussion and/or role plays  Summary of Patient Progress: Patient was encouraged and invited to attend group. Patient did not attend group. Social worker will continue to encourage group participation in the future.       Therapeutic Modalities:   Cognitive Behavioral Therapy Feelings Identification Dialectical Behavioral Therapy   Darin Engels, [place] 01/04/2018 10:29 AM

## 2018-01-04 NOTE — Progress Notes (Signed)
Patient ID: Katherine Stephens, female   DOB: 19-Dec-1986, 32 y.o.   MRN: [id number] Pt's friend, Mariel Sleet, [phone],GFREVQ to provide several numbers that may be helpful to staff.  Pt's mother, [place], [phone] with her sister [place])    CSW let her know that at this time no release in place and that when there was one, CSW staff would be in touch with her and family about appropriate updates.  Dossie Arbour, LCSW
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
## 2018-01-04 NOTE — H&P (Addendum)
Psychiatric Admission Assessment Adult  Patient Identification: Katherine Stephens MRN:  [id number] Date of Evaluation:  01/04/2018 Chief Complaint:  Schizophrenia Principal Diagnosis: Undifferentiated schizophrenia ([place]) Diagnosis:   Patient Active Problem List   Diagnosis Date Noted  . Undifferentiated schizophrenia ([place]) [F20.3] 12/21/2017    Priority: High  . Noncompliance [Z91.19] 12/21/2017  . Schizophreniform disorder ([place]) [F20.81] 10/18/2017  . Auditory hallucinations [R44.0] 10/18/2017  . Poor insight into neurotic condition [F48.9] 10/18/2017  . ASCUS with positive high risk HPV cervical [R87.610, R87.810] 10/07/2017  . Hypertension [I10] 09/29/2017  . Tobacco use disorder [F17.200] 09/23/2017  . Anxiety attack [F41.0] 08/18/2017   History of Present Illness:   Identifying data. Katherine Stephens is a 32 year old female with no past psychiatric history.  Chief complaint. "My friend told me to come."  History of present illness. Information was onbtained from the patirnt and the chart. The patient came to the ER at the urging of her friends who found her disturbingly dysfunctional, paranoid and hallucinating. The patient denies any symptoms of depression, anxiety or psychosis. She does not believe that anything is wrong with her but gives me a very convoluted story full of paranoia. She paid $6,000 last summer to take 2 sauumer classes at [employer] with a plan to continue undergraduate degree at [employer]. Because of FBI, hacking, and prejudice, she has not been able to accomplish her goal, was evicted and lost her registration plates. She still intends to go to [employer] apply for school and [employer] and start school on Friday. Denies suicidal ideation or substance abuse.   Past psychiatric history. Never hospitalized,. No suicide attempts. Briefly treated with Paxil and Lexapro 10 years ago and in [facility] counseling.  Family psychiatric history. Cousin committed suicide.  Social  history. She grew up in [place] and went to college some. She is "pastor's daughter". Her father lives in [place]. She claims to speak to him daily. Still has [place] cell phone number. She is still married but her husband is stationed at [employer]. She was in the [employer] for 8 months. Apparently too short for benefits. Has a son who lives in [place] with grandparents. Was evicted from her apartment for not paying. She is almost homeless and unemployed. She is allowed to stay with her pastor for a while. Owns a car but it is not insured and police took license plates.  Tried to call her friend Roselyn Reef [phone] but no response. Her father in [place] did not respond either.  Total Time spent with patient: 1 hour  Is the patient at risk to self? No.  Has the patient been a risk to self in the past 6 months? No.  Has the patient been a risk to self within the distant past? No.  Is the patient a risk to others? No.  Has the patient been a risk to others in the past 6 months? No.  Has the patient been a risk to others within the distant past? No.   Prior Inpatient Therapy:   Prior Outpatient Therapy:    Alcohol Screening: 1. How often do you have a drink containing alcohol?: Monthly or less 2. How many drinks containing alcohol do you have on a typical day when you are drinking?: 1 or 2 3. How often do you have six or more drinks on one occasion?: Never AUDIT-C Score: 1 4. How often during the last year have you found that you were not able to stop drinking once you had started?: Never 5.  How often during the last year have you failed to do what was normally expected from you becasue of drinking?: Never 6. How often during the last year have you needed a first drink in the morning to get yourself going after a heavy drinking session?: Never 7. How often during the last year have you had a feeling of guilt of remorse after drinking?: Never 8. How often during the last year have you been  unable to remember what happened the night before because you had been drinking?: Never 9. Have you or someone else been injured as a result of your drinking?: No 10. Has a relative or friend or a doctor or another health worker been concerned about your drinking or suggested you cut down?: No Alcohol Use Disorder Identification Test Final Score (AUDIT): 1 Intervention/Follow-up: AUDIT Score <7 follow-up not indicated Substance Abuse History in the last 12 months:  No. Consequences of Substance Abuse: NA Previous Psychotropic Medications: Yes  Psychological Evaluations: No  Past Medical History:  Past Medical History:  Diagnosis Date  . Hypertension   . Schizo affective schizophrenia [facility])     Past Surgical History:  Procedure Laterality Date  . NO PAST SURGERIES     Family History:  Family History  Problem Relation Age of Onset  . Hypertension Mother   . Hypertension Father   . Diabetes Maternal Grandmother    Tobacco Screening: Have you used any form of tobacco in the last 30 days? (Cigarettes, Smokeless Tobacco, Cigars, and/or Pipes): Yes Tobacco use, Select all that apply: 4 or less cigarettes per day Are you interested in Tobacco Cessation Medications?: No, patient refused Counseled patient on smoking cessation including recognizing danger situations, developing coping skills and basic information about quitting provided: Refused/Declined practical counseling Social History:  Social History   Substance and Sexual Activity  Alcohol Use Yes   Comment: 2 x week     Social History   Substance and Sexual Activity  Drug Use No    Additional Social History:      History of alcohol / drug use?: No history of alcohol / drug abuse                    Allergies:   Allergies  Allergen Reactions  . Amoxicillin   . Penicillins Hives    Has patient had a PCN reaction causing immediate rash, facial/tongue/throat swelling, SOB or lightheadedness with hypotension:  Yes Has patient had a PCN reaction causing severe rash involving mucus membranes or skin necrosis: No Has patient had a PCN reaction that required hospitalization: No Has patient had a PCN reaction occurring within the last 10 years: No If all of the above answers are "NO", then may proceed with Cephalosporin use.   [place] [Pseudoephedrine Hcl]    Lab Results:  Results for orders placed or performed during the hospital encounter of 01/04/18 (from the past 48 hour(s))  Hemoglobin A1c     Status: None   Collection Time: 01/04/18  6:52 AM  Result Value Ref Range   Hgb A1c MFr Bld 5.1 4.8 - 5.6 %    Comment: (NOTE) Pre diabetes:          5.7%-6.4% Diabetes:              >6.4% Glycemic control for   <7.0% adults with diabetes    Mean Plasma Glucose 99.67 mg/dL    Comment: Performed at [facility] [address], [place], [place] [address]  Lipid panel     Status: Abnormal   Collection Time: 01/04/18  6:52 AM  Result Value Ref Range   Cholesterol 178 0 - 200 mg/dL   Triglycerides 79 <150 mg/dL   HDL 53 >40 mg/dL   Total CHOL/HDL Ratio 3.4 RATIO   VLDL 16 0 - 40 mg/dL   LDL Cholesterol 109 (H) 0 - 99 mg/dL    Comment:        Total Cholesterol/HDL:CHD Risk Coronary Heart Disease Risk Table                     Men   Women  1/2 Average Risk   3.4   3.3  Average Risk       5.0   4.4  2 X Average Risk   9.6   7.1  3 X Average Risk  23.4   11.0        Use the calculated Patient Ratio above and the CHD Risk Table to determine the patient's CHD Risk.        ATP III CLASSIFICATION (LDL):  <100     mg/dL   Optimal  100-129  mg/dL   Near or Above                    Optimal  130-159  mg/dL   Borderline  160-189  mg/dL   High  >190     mg/dL   Very High Performed at [facility], [address] [place], [place] [address]   TSH     Status: None   Collection Time: 01/04/18  6:52 AM  Result Value Ref Range   TSH 2.804 0.350 - 4.500 uIU/mL    Comment: Performed  by a 3rd Generation assay with a functional sensitivity of <=0.01 uIU/mL. Performed at [facility], [address] [place], [place] [address]     Blood Alcohol level:  Lab Results  Component Value Date   [facility] <10 01/03/2018   ETH <10 82/95/6213    Metabolic Disorder Labs:  Lab Results  Component Value Date   HGBA1C 5.1 01/04/2018   MPG 99.67 01/04/2018   No results found for: PROLACTIN Lab Results  Component Value Date   CHOL 178 01/04/2018   TRIG 79 01/04/2018   HDL 53 01/04/2018   CHOLHDL 3.4 01/04/2018   VLDL 16 01/04/2018   LDLCALC 109 (H) 01/04/2018    Current Medications: Current Facility-Administered Medications  Medication Dose Route Frequency Provider Last Rate Last Dose  . acetaminophen (TYLENOL) tablet 650 mg  650 mg Oral Q6H PRN Clapacs, John T, MD      . alum & mag hydroxide-simeth (MAALOX/MYLANTA) 200-200-20 MG/5ML suspension 30 mL  30 mL Oral Q4H PRN Clapacs, John T, MD      . hydrOXYzine (ATARAX/VISTARIL) tablet 25 mg  25 mg Oral TID PRN Clapacs, John T, MD      . magnesium hydroxide (MILK OF MAGNESIA) suspension 30 mL  30 mL Oral Daily PRN Clapacs, John T, MD      . risperiDONE (RISPERDAL M-TABS) disintegrating tablet 2 mg  2 mg Oral BID Pucilowska, Jolanta B, MD      . temazepam (RESTORIL) capsule 15 mg  15 mg Oral QHS Pucilowska, Jolanta B, MD       PTA Medications: Medications Prior to Admission  Medication Sig Dispense Refill Last Dose  . risperiDONE (RISPERDAL) 1 MG tablet Take 1 tablet (1 mg total) by mouth 2 (two) times  daily. (Patient not taking: Reported on 01/04/2018) 60 tablet 1 Not Taking at Unknown time    Musculoskeletal: Strength & Muscle Tone: within normal limits Gait & Station: normal Patient leans: N/A  Psychiatric Specialty Exam: I reviewed physical examination performed in the ER and agree with the findings. Physical Exam  Nursing note and vitals reviewed. Psychiatric: Her speech is normal. Her affect is  inappropriate. She is actively hallucinating. Thought content is paranoid and delusional. Cognition and memory are normal. She expresses impulsivity.    Review of Systems  Neurological: Negative.   Psychiatric/Behavioral: Positive for hallucinations.  All other systems reviewed and are negative.   Blood pressure 107/78, pulse (!) 115, temperature 98.1 F (36.7 C), temperature source Oral, resp. rate 18, height 5\' 7"  (1.702 m), weight 67.1 kg (148 lb), last menstrual period 12/08/2017, SpO2 94 %.Body mass index is 23.18 kg/m.  See SRA                                                  Sleep:  Number of Hours: 3.5    Treatment Plan Summary: Daily contact with patient to assess and evaluate symptoms and progress in treatment and Medication management   Ms. Lambrecht is 32 year old female with no past psychiatric history admitted for psychotic break and deteriorating social functioning.  #Psychosis -start Risperdal 2 mg BID -start Restoril 30 mg nightly  #Metabolic syndrome monitoring -Lipid panel, TSH, HgbA1C are pending EKG, pending -pregnancy test is negative  #Disposition -discharge to her pastor;s place -follow up with [facility]   Observation Level/Precautions:  15 minute checks  Laboratory:  CBC Chemistry Profile UDS UA  Psychotherapy:    Medications:    Consultations:    Discharge Concerns:    Estimated LOS:  Other:     Physician Treatment Plan for Primary Diagnosis: Undifferentiated schizophrenia ([place]) Long Term Goal(s): Improvement in symptoms so as ready for discharge  Short Term Goals: Ability to identify changes in lifestyle to reduce recurrence of condition will improve, Ability to verbalize feelings will improve, Ability to disclose and discuss suicidal ideas, Ability to demonstrate self-control will improve, Ability to identify and develop effective coping behaviors will improve, Ability to maintain clinical measurements within normal limits  will improve and Ability to identify triggers associated with substance abuse/mental health issues will improve  Physician Treatment Plan for Secondary Diagnosis: Principal Problem:   Undifferentiated schizophrenia ([place]) Active Problems:   Tobacco use disorder  Long Term Goal(s): NA  Short Term Goals: NA  I certify that inpatient services furnished can reasonably be expected to improve the patient's condition.    Orson Slick, MD 1/16/20191:41 PM

## 2018-01-04 NOTE — Tx Team (Signed)
Initial Treatment Plan 01/04/2018 3:04 AM Katherine Stephens PQD:[id number]    PATIENT STRESSORS: Educational concerns Financial difficulties Medication change or noncompliance   PATIENT STRENGTHS: Average or above average intelligence Capable of independent living Communication skills General fund of knowledge Religious Affiliation Supportive family/friends Work skills   PATIENT IDENTIFIED PROBLEMS: Psychosis 01/04/2018  Paranoia 01/04/2018  Homelessness 01/04/2018                 DISCHARGE CRITERIA:  Ability to meet basic life and health needs Adequate post-discharge living arrangements Improved stabilization in mood, thinking, and/or behavior Safe-care adequate arrangements made Verbal commitment to aftercare and medication compliance  PRELIMINARY DISCHARGE PLAN: Outpatient therapy Placement in alternative living arrangements  PATIENT/FAMILY INVOLVEMENT: This treatment plan has been presented to and reviewed with the patient, Katherine Stephens, and/or family member.  The patient and family have been given the opportunity to ask questions and make suggestions.  Derek Mound, RN 01/04/2018, 3:04 AM

## 2018-01-05 DIAGNOSIS — Z5181 Encounter for therapeutic drug level monitoring: Secondary | ICD-10-CM

## 2018-01-05 MED ORDER — RISPERIDONE 1 MG PO TBDP
3.0000 mg | ORAL_TABLET | Freq: Two times a day (BID) | ORAL | Status: DC
  Administered 2018-01-05 – 2018-01-13 (×16): 3 mg via ORAL
  Filled 2018-01-05 (×18): qty 3

## 2018-01-05 MED ORDER — PALIPERIDONE PALMITATE 234 MG/1.5ML IM SUSP
234.0000 mg | INTRAMUSCULAR | Status: DC
  Administered 2018-01-05: 234 mg via INTRAMUSCULAR
  Filled 2018-01-05: qty 1.5

## 2018-01-05 MED ORDER — RISPERIDONE 2 MG PO TBDP
3.0000 mg | ORAL_TABLET | Freq: Two times a day (BID) | ORAL | Status: DC
  Filled 2018-01-05: qty 1

## 2018-01-05 NOTE — Progress Notes (Signed)
D- Patient alert and oriented. Patient presents in a pleasant mood on assessment stating that she slept "too well" last night. Patient denies SI, HI, AVH, and pain at this time. Patient also denies any signs/symptoms of depression and anxiety.   A- Scheduled medications administered to patient, per MD orders. Support and encouragement provided.  Routine safety checks conducted every 15 minutes.  Patient informed to notify staff with problems or concerns.  R- No adverse drug reactions noted. Patient contracts for safety at this time. Patient compliant with medications and treatment plan. Patient receptive, calm, and cooperative. Patient interacts well with others on the unit.  Patient remains safe at this time.

## 2018-01-05 NOTE — [facility] Group Notes (Signed)
LCSW Group Therapy Note 01/05/2018 9:00 AM  Type of Therapy and Topic:  Group Therapy:  Setting Goals  Participation Level:  Did Not Attend  Description of Group: In this process group, patients discussed using strengths to work toward goals and address challenges.  Patients identified two positive things about themselves and one goal they were working on.  Patients were given the opportunity to share openly and support each other's plan for self-empowerment.  The group discussed the value of gratitude and were encouraged to have a daily reflection of positive characteristics or circumstances.  Patients were encouraged to identify a plan to utilize their strengths to work on current challenges and goals.  Therapeutic Goals 1. Patient will verbalize personal strengths/positive qualities and relate how these can assist with achieving desired personal goals 2. Patients will verbalize affirmation of peers plans for personal change and goal setting 3. Patients will explore the value of gratitude and positive focus as related to successful achievement of goals 4. Patients will verbalize a plan for regular reinforcement of personal positive qualities and circumstances.  Summary of Patient Progress:       Therapeutic Modalities Cognitive Behavioral Therapy Motivational Interviewing    Devona Konig, LCSW 01/05/2018 2:52 PM

## 2018-01-05 NOTE — Plan of Care (Signed)
Patient has shown some interest in leisure activity by being observed interacting well with other members on the unit without any complications or issues thus far. Patient states that she slept "too well" last night. Patient has verbalized understanding of her medication regimen and has been in compliance. Patient has the ability to cope but did not attend unit groups today. Patient denies SI/HI/AVH at this time. Patient also denies any signs/symptoms of depression and anxiety at this time. Patient has the ability to make decisions and is safe on the unit at this time.

## 2018-01-05 NOTE — [facility] Group Notes (Signed)
01/05/2018  Time: 1:00PM  Type of Therapy/Topic:  Group Therapy:  Balance in Life  Participation Level:  Did Not Attend  Description of Group:   This group will address the concept of balance and how it feels and looks when one is unbalanced. Patients will be encouraged to process areas in their lives that are out of balance and identify reasons for remaining unbalanced. Facilitators will guide patients in utilizing problem-solving interventions to address and correct the stressor making their life unbalanced. Understanding and applying boundaries will be explored and addressed for obtaining and maintaining a balanced life. Patients will be encouraged to explore ways to assertively make their unbalanced needs known to significant others in their lives, using other group members and facilitator for support and feedback.  Therapeutic Goals: 1. Patient will identify two or more emotions or situations they have that consume much of in their lives. 2. Patient will identify signs/triggers that life has become out of balance:  3. Patient will identify two ways to set boundaries in order to achieve balance in their lives:  4. Patient will demonstrate ability to communicate their needs through discussion and/or role plays  Summary of Patient Progress: Pt was invited to attend group but chose not to attend. CSW will continue to encourage pt to attend group throughout their admission.    Therapeutic Modalities:   Cognitive Behavioral Therapy Solution-Focused Therapy Assertiveness Training  Alden Hipp, MSW, LCSW 01/05/2018 1:42 PM

## 2018-01-05 NOTE — Progress Notes (Signed)
[facility] MD Progress Note  01/05/2018 4:34 PM Katherine Stephens  MRN:  [id number]   Subjective:    Katherine Stephens is resting in her room, feels tired. She did take her medications last night and this morning. Slept well. Has no complaints. She is able to hold a brief sensible conversation but is still paranoid, delusional and mumbles under her breath. She agreed to injectable antipsychotic.  Spoke again with Katherine Stephens, her friend.  Treatment plan. We will continue oral invega and start invega sustenna injections.   Social/dispoaition. She is homeless, her car has mo plates, no insurance.  Principal Problem: Undifferentiated schizophrenia (Major) Diagnosis:   Patient Active Problem List   Diagnosis Date Noted  . Undifferentiated schizophrenia ([place]) [F20.3] 12/21/2017    Priority: High  . Noncompliance [Z91.19] 12/21/2017  . Schizophreniform disorder ([place]) [F20.81] 10/18/2017  . Auditory hallucinations [R44.0] 10/18/2017  . Poor insight into neurotic condition [F48.9] 10/18/2017  . ASCUS with positive high risk HPV cervical [R87.610, R87.810] 10/07/2017  . Hypertension [I10] 09/29/2017  . Tobacco use disorder [F17.200] 09/23/2017  . Anxiety attack [F41.0] 08/18/2017   Total Time spent with patient: 30 minutes  Past Psychiatric History: schizophrenia  Past Medical History:  Past Medical History:  Diagnosis Date  . Hypertension   . Schizo affective schizophrenia [facility])     Past Surgical History:  Procedure Laterality Date  . NO PAST SURGERIES     Family History:  Family History  Problem Relation Age of Onset  . Hypertension Mother   . Hypertension Father   . Diabetes Maternal Grandmother    Family Psychiatric  History: none reported Social History:  Social History   Substance and Sexual Activity  Alcohol Use Yes   Comment: 2 x week     Social History   Substance and Sexual Activity  Drug Use No    Social History   Socioeconomic History  . Marital status: Divorced     Spouse name: None  . Number of children: None  . Years of education: None  . Highest education level: None  Social Needs  . Financial resource strain: None  . Food insecurity - worry: None  . Food insecurity - inability: None  . Transportation needs - medical: None  . Transportation needs - non-medical: None  Occupational History  . None  Tobacco Use  . Smoking status: Current Some Day Smoker    Types: Cigarettes  . Smokeless tobacco: Never Used  . Tobacco comment: smokes cigarettes occasionally. Started smoking at age 55  Substance and Sexual Activity  . Alcohol use: Yes    Comment: 2 x week  . Drug use: No  . Sexual activity: Not Currently    Birth control/protection: None  Other Topics Concern  . None  Social History Narrative  . None   Additional Social History:    History of alcohol / drug use?: No history of alcohol / drug abuse                    Sleep: Fair  Appetite:  Fair  Current Medications: Current Facility-Administered Medications  Medication Dose Route Frequency Provider Last Rate Last Dose  . acetaminophen (TYLENOL) tablet 650 mg  650 mg Oral Q6H PRN Clapacs, John T, MD      . alum & mag hydroxide-simeth (MAALOX/MYLANTA) 200-200-20 MG/5ML suspension 30 mL  30 mL Oral Q4H PRN Clapacs, John T, MD      . hydrOXYzine (ATARAX/VISTARIL) tablet 25 mg  25 mg Oral TID  PRN Clapacs, Madie Reno, MD      . magnesium hydroxide (MILK OF MAGNESIA) suspension 30 mL  30 mL Oral Daily PRN Clapacs, John T, MD      . paliperidone (INVEGA SUSTENNA) injection 234 mg  234 mg Intramuscular Q28 days Elvin Banker B, MD      . risperiDONE (RISPERDAL M-TABS) disintegrating tablet 3 mg  3 mg Oral BID Shahida Schnackenberg B, MD      . temazepam (RESTORIL) capsule 15 mg  15 mg Oral QHS Brittanyann Wittner B, MD        Lab Results:  Results for orders placed or performed during the hospital encounter of 01/04/18 (from the past 48 hour(s))  Hemoglobin A1c     Status: None    Collection Time: 01/04/18  6:52 AM  Result Value Ref Range   Hgb A1c MFr Bld 5.1 4.8 - 5.6 %    Comment: (NOTE) Pre diabetes:          5.7%-6.4% Diabetes:              >6.4% Glycemic control for   <7.0% adults with diabetes    Mean Plasma Glucose 99.67 mg/dL    Comment: Performed at [facility], [place] [place], [place] [address]  Lipid panel     Status: Abnormal   Collection Time: 01/04/18  6:52 AM  Result Value Ref Range   Cholesterol 178 0 - 200 mg/dL   Triglycerides 79 <150 mg/dL   HDL 53 >40 mg/dL   Total CHOL/HDL Ratio 3.4 RATIO   VLDL 16 0 - 40 mg/dL   LDL Cholesterol 109 (H) 0 - 99 mg/dL    Comment:        Total Cholesterol/HDL:CHD Risk Coronary Heart Disease Risk Table                     Men   Women  1/2 Average Risk   3.4   3.3  Average Risk       5.0   4.4  2 X Average Risk   9.6   7.1  3 X Average Risk  23.4   11.0        Use the calculated Patient Ratio above and the CHD Risk Table to determine the patient's CHD Risk.        ATP III CLASSIFICATION (LDL):  <100     mg/dL   Optimal  100-129  mg/dL   Near or Above                    Optimal  130-159  mg/dL   Borderline  160-189  mg/dL   High  >190     mg/dL   Very High Performed at [facility], [address] [place], [place] [address]   TSH     Status: None   Collection Time: 01/04/18  6:52 AM  Result Value Ref Range   TSH 2.804 0.350 - 4.500 uIU/mL    Comment: Performed by a 3rd Generation assay with a functional sensitivity of <=0.01 uIU/mL. Performed at [facility], [address] [place], [place] [address]   hCG, quantitative, pregnancy     Status: None   Collection Time: 01/04/18  6:52 AM  Result Value Ref Range   hCG, Beta Chain, Quant, S <1 <5 mIU/mL    Comment:          GEST. AGE      CONC.  (  mIU/mL)   <=1 WEEK        5 - 50     2 WEEKS       50 - 500     3 WEEKS       100 - 10,000     4 WEEKS     1,000 - 30,000     5 WEEKS     3,500 - 115,000    6-8 WEEKS     12,000 - 270,000    12 WEEKS     15,000 - 220,000        FEMALE AND NON-PREGNANT FEMALE:     LESS THAN 5 mIU/mL Performed at [facility], [address] [place], [place] [address]     Blood Alcohol level:  Lab Results  Component Value Date   [facility] <10 01/03/2018   ETH <10 37/90/2409    Metabolic Disorder Labs: Lab Results  Component Value Date   HGBA1C 5.1 01/04/2018   MPG 99.67 01/04/2018   No results found for: PROLACTIN Lab Results  Component Value Date   CHOL 178 01/04/2018   TRIG 79 01/04/2018   HDL 53 01/04/2018   CHOLHDL 3.4 01/04/2018   VLDL 16 01/04/2018   LDLCALC 109 (H) 01/04/2018    Physical Findings: AIMS: Facial and Oral Movements Muscles of Facial Expression: None, normal Lips and Perioral Area: None, normal Jaw: None, normal Tongue: None, normal,Extremity Movements Upper (arms, wrists, hands, fingers): None, normal Lower (legs, knees, ankles, toes): None, normal, Trunk Movements Neck, shoulders, hips: None, normal, Overall Severity Severity of abnormal movements (highest score from questions above): None, normal Incapacitation due to abnormal movements: None, normal Patient's awareness of abnormal movements (rate only patient's report): No Awareness, Dental Status Current problems with teeth and/or dentures?: No Does patient usually wear dentures?: No  CIWA:  CIWA-Ar Total: 2 COWS:  COWS Total Score: 2  Musculoskeletal: Strength & Muscle Tone: within normal limits Gait & Station: normal Patient leans: N/A  Psychiatric Specialty Exam: Physical Exam  Nursing note and vitals reviewed. Psychiatric: Her speech is normal. Her affect is blunt and inappropriate. She is slowed, withdrawn and actively hallucinating. Thought content is paranoid and delusional. Cognition and memory are normal. She expresses impulsivity.    Review of Systems  Neurological: Negative.   Psychiatric/Behavioral: Positive for hallucinations.  All  other systems reviewed and are negative.   Blood pressure 126/75, pulse 87, temperature 98.1 F (36.7 C), temperature source Oral, resp. rate 18, height 5\' 7"  (1.702 m), weight 67.1 kg (148 lb), last menstrual period 12/08/2017, SpO2 94 %.Body mass index is 23.18 kg/m.  General Appearance: Casual  Eye Contact:  Minimal  Speech:  Clear and Coherent  Volume:  Normal  Mood:  Dysphoric  Affect:  Congruent  Thought Process:  Disorganized and Descriptions of Associations: Tangential  Orientation:  Full (Time, Place, and Person)  Thought Content:  Delusions, Hallucinations: Auditory and Paranoid Ideation  Suicidal Thoughts:  No  Homicidal Thoughts:  No  Memory:  Immediate;   Fair Recent;   Fair Remote;   Fair  Judgement:  Poor  Insight:  Lacking  Psychomotor Activity:  Decreased  Concentration:  Concentration: Fair and Attention Span: Fair  Recall:  [employer] of Knowledge:  Fair  Language:  Fair  Akathisia:  No  Handed:  Right  AIMS (if indicated):     Assets:  Communication Skills Desire for Improvement Physical Health Resilience Social Support Transportation  ADL's:  Intact  Cognition:  WNL  Sleep:  Number of Hours: 7.15     Treatment Plan Summary: Daily contact with patient to assess and evaluate symptoms and progress in treatment and Medication management   Katherine Stephens is 32 year old female with no past psychiatric history admitted for psychotic break and deteriorating social functioning.  #Psychosis -increase Risperdal to 3 mg BID -start Invega sustenna injections -start Restoril 30 mg nightly  #Metabolic syndrome monitoring -Lipid panel, TSH, HgbA1C are pending EKG, pending -pregnancy test is negative  #Disposition -discharge to her pastor's place -follow up with [facility]    Orson Slick, MD 01/05/2018, 4:34 PM

## 2018-01-05 NOTE — Progress Notes (Addendum)
Recreation Therapy Notes  INPATIENT RECREATION THERAPY ASSESSMENT  Patient Details Name: Katherine Stephens MRN: [id number] DOB: Jul 10, 1986 Today's Date: 01/05/2018  During the assessment patient stated several times that someone is messing with her right ear. She is fixated on being released to register for classes. Patient states satellite communication is keeping her from concentrating.Patient stated she is hearing Rico Junker voice stating he is going to get away with it. She states that Kevan Ny is a veteran who sleeps around with a lot of females and get other people in trouble. Patient denies any hallucinations. Patient Stressors: Friends, Banker:   Exercise, Talking, Music, Other (Comment)(Read, Herbal teas)  Personal Challenges: Concentration, School Performance, Stress Management, Trusting Others  Leisure Interests (2+):  Exercise - Walking, Music - Listen  Awareness of Community Resources:  Yes  Community Resources:  [place], [place]  Current Use: Yes  If no, Barriers?:    Patient Strengths:  attending and helping out in church  Patient Identified Areas of Improvement:  Being more involved in the community  Current Recreation Participation:  Exercise  Patient Goal for Hospitalization:  To get out and get registered for classes  [place] of Residence:  [address] of Residence:  [place]   Current SI (including self-harm):  No  Current HI:  No  Consent to Intern Participation: N/A   Nyheim Seufert 01/05/2018, 11:15 AM

## 2018-01-05 NOTE — Progress Notes (Signed)
Recreation Therapy Notes  Date: 01.17.2019  Time: 9:30 am   Location: Craft Room  Behavioral response: N/A  Intervention Topic: Anger  Discussion/Intervention: Patient did not attend group. Clinical Observations/Feedback:  Patient did not attend group.  Kavitha Lansdale LRT/CTRS         Syed Zukas 01/05/2018 10:44 AM

## 2018-01-06 MED ORDER — PALIPERIDONE PALMITATE 156 MG/ML IM SUSP: 156.0000 mg | Freq: Once | INTRAMUSCULAR | Status: DC

## 2018-01-06 MED ORDER — TEMAZEPAM 15 MG PO CAPS: 15.0000 mg | ORAL_CAPSULE | Freq: Every evening | ORAL | Status: DC | PRN

## 2018-01-06 MED ORDER — TRAZODONE HCL 100 MG PO TABS
100.0000 mg | ORAL_TABLET | Freq: Every day | ORAL | Status: DC
  Administered 2018-01-06 – 2018-01-12 (×5): 100 mg via ORAL
  Filled 2018-01-06 (×6): qty 1

## 2018-01-06 NOTE — Progress Notes (Signed)
D- Patient alert and oriented. Patient presents in a pleasant mood on assessment stating that she didn't sleep well last night because her right arm was hurting from her Invega shot yesterday. Patient states that "I'm feeling lethargic today, so I'm just going to rest". Patient denies SI, HI, AVH, at this time. Patient denies depression and anxiety at this time. Patient's goal for today is "it's important for me to be able to work on getting my new classes started for [employer] graduate degree", which she will accomplish by "I'll be talking with health professionals".  A- Scheduled medications administered to patient, per MD orders. Support and encouragement provided.  Routine safety checks conducted every 15 minutes.  Patient informed to notify staff with problems or concerns.  R- No adverse drug reactions noted. Patient contracts for safety at this time. Patient compliant with medications and treatment plan. Patient receptive, calm, and cooperative. Patient interacts well with others on the unit.  Patient remains safe at this time.

## 2018-01-06 NOTE — Tx Team (Addendum)
Interdisciplinary Treatment and Diagnostic Plan Update  01/06/2018 Time of Session: 10:55 AM Katherine Stephens MRN: [id number]  Principal Diagnosis: Undifferentiated schizophrenia [facility])  Secondary Diagnoses: Principal Problem:   Undifferentiated schizophrenia ([place]) Active Problems:   Tobacco use disorder   Current Medications:  Current Facility-Administered Medications  Medication Dose Route Frequency Provider Last Rate Last Dose  . acetaminophen (TYLENOL) tablet 650 mg  650 mg Oral Q6H PRN Clapacs, Madie Reno, MD   650 mg at 01/06/18 1538  . alum & mag hydroxide-simeth (MAALOX/MYLANTA) 200-200-20 MG/5ML suspension 30 mL  30 mL Oral Q4H PRN Clapacs, John T, MD      . hydrOXYzine (ATARAX/VISTARIL) tablet 25 mg  25 mg Oral TID PRN Clapacs, John T, MD      . magnesium hydroxide (MILK OF MAGNESIA) suspension 30 mL  30 mL Oral Daily PRN Clapacs, Madie Reno, MD      . Derrill Memo ON 01/09/2018] paliperidone (INVEGA SUSTENNA) injection 156 mg  156 mg Intramuscular Once Pucilowska, Jolanta B, MD      . paliperidone (INVEGA SUSTENNA) injection 234 mg  234 mg Intramuscular Q28 days Pucilowska, Jolanta B, MD   234 mg at 01/05/18 1817  . risperiDONE (RISPERDAL M-TABS) disintegrating tablet 3 mg  3 mg Oral BID Pucilowska, Jolanta B, MD   3 mg at 01/05/18 1800  . temazepam (RESTORIL) capsule 15 mg  15 mg Oral QHS PRN Pucilowska, Jolanta B, MD      . traZODone (DESYREL) tablet 100 mg  100 mg Oral QHS Pucilowska, Jolanta B, MD       PTA Medications: Medications Prior to Admission  Medication Sig Dispense Refill Last Dose  . risperiDONE (RISPERDAL) 1 MG tablet Take 1 tablet (1 mg total) by mouth 2 (two) times daily. (Patient not taking: Reported on 01/04/2018) 60 tablet 1 Not Taking at Unknown time    Patient Stressors: Educational concerns Financial difficulties Medication change or noncompliance  Patient Strengths: Average or above average intelligence Capable of independent living Communication  skills General fund of knowledge Religious Affiliation Supportive family/friends Work skills  Treatment Modalities: Medication Management, Group therapy, Case management,  1 to 1 session with clinician, Psychoeducation, Recreational therapy.   Physician Treatment Plan for Primary Diagnosis: Undifferentiated schizophrenia ([place]) Long Term Goal(s): Improvement in symptoms so as ready for discharge NA   Short Term Goals: Ability to identify changes in lifestyle to reduce recurrence of condition will improve Ability to verbalize feelings will improve Ability to disclose and discuss suicidal ideas Ability to demonstrate self-control will improve Ability to identify and develop effective coping behaviors will improve Ability to maintain clinical measurements within normal limits will improve Ability to identify triggers associated with substance abuse/mental health issues will improve NA  Medication Management: Evaluate patient's response, side effects, and tolerance of medication regimen.  Therapeutic Interventions: 1 to 1 sessions, Unit Group sessions and Medication administration.  Evaluation of Outcomes: Progressing  Physician Treatment Plan for Secondary Diagnosis: Principal Problem:   Undifferentiated schizophrenia ([place]) Active Problems:   Tobacco use disorder  Long Term Goal(s): Improvement in symptoms so as ready for discharge NA   Short Term Goals: Ability to identify changes in lifestyle to reduce recurrence of condition will improve Ability to verbalize feelings will improve Ability to disclose and discuss suicidal ideas Ability to demonstrate self-control will improve Ability to identify and develop effective coping behaviors will improve Ability to maintain clinical measurements within normal limits will improve Ability to identify triggers associated with substance abuse/mental health issues will improve  NA     Medication Management: Evaluate patient's response,  side effects, and tolerance of medication regimen.  Therapeutic Interventions: 1 to 1 sessions, Unit Group sessions and Medication administration.  Evaluation of Outcomes: Progressing   RN Treatment Plan for Primary Diagnosis: Undifferentiated schizophrenia ([place]) Long Term Goal(s): Knowledge of disease and therapeutic regimen to maintain health will improve  Short Term Goals: Ability to demonstrate self-control, Ability to identify and develop effective coping behaviors will improve and Compliance with prescribed medications will improve  Medication Management: RN will administer medications as ordered by provider, will assess and evaluate patient's response and provide education to patient for prescribed medication. RN will report any adverse and/or side effects to prescribing provider.  Therapeutic Interventions: 1 on 1 counseling sessions, Psychoeducation, Medication administration, Evaluate responses to treatment, Monitor vital signs and CBGs as ordered, Perform/monitor CIWA, COWS, AIMS and Fall Risk screenings as ordered, Perform wound care treatments as ordered.  Evaluation of Outcomes: Progressing   LCSW Treatment Plan for Primary Diagnosis: Undifferentiated schizophrenia ([place]) Long Term Goal(s): Safe transition to appropriate next level of care at discharge, Engage patient in therapeutic group addressing interpersonal concerns.  Short Term Goals: Engage patient in aftercare planning with referrals and resources and Increase skills for wellness and recovery  Therapeutic Interventions: Assess for all discharge needs, 1 to 1 time with Social worker, Explore available resources and support systems, Assess for adequacy in community support network, Educate family and significant other(s) on suicide prevention, Complete Psychosocial Assessment, Interpersonal group therapy.  Evaluation of Outcomes: Progressing   Progress in Treatment: Attending groups: Yes. Participating in groups:  Yes. Taking medication as prescribed: Yes. Toleration medication: Yes. Family/Significant other contact made: No, will contact:  CSW given consent to contact her pastor and his wife. Patient understands diagnosis: Yes. Discussing patient identified problems/goals with staff: Yes. Medical problems stabilized or resolved: Yes. Denies suicidal/homicidal ideation: Yes. Issues/concerns per patient self-inventory: No. Other: n/a  New problem(s) identified: No, Describe:  No new problems identified  New Short Term/Long Term Goal(s): Pt's goal is "to get out and go back to school so I can finish my Master's Degree"  Discharge Plan or Barriers: Pt will discharge to the home of her pastor and his wife. She will follow-up with [facility] upon discharge Reason for Continuation of Hospitalization: Delusions  [place]ia Medication stabilization  Estimated Length of Stay: 3-5 days   Recreational Therapy: Patient Stressors: Friends, School Patient Goal: Patient will participate actively in groups and activities x5 days.   Attendees: Patient: Katherine Stephens 01/06/2018 5:06 PM  Physician: Orson Slick, MD 01/06/2018 5:06 PM  Nursing: Elige Radon, RN 01/06/2018 5:06 PM  RN Care Manager: 01/06/2018 5:06 PM  Social Worker: Derrek Gu, LCSW 01/06/2018 5:06 PM  Recreational Therapist: Roanna Epley, LRT 01/06/2018 5:06 PM  Other: Donnetta Hail, Chaplain 01/06/2018 5:06 PM  Other: Sherrian Divers, PSS, [place] 01/06/2018 5:06 PM  Other: 01/06/2018 5:06 PM    Scribe for Treatment Team: Devona Konig, LCSW 01/06/2018 5:06 PM

## 2018-01-06 NOTE — Progress Notes (Signed)
At the beginning of this shift, patient was in bed.  She stayed in bed and unable to attend group. Patient was isolative with minimal interactions with staff and peers. She refused her bedtime medication reporting that "I am already sleepy". Currently sleeping and staff continue to monitor.

## 2018-01-06 NOTE — Progress Notes (Signed)
[facility] MD Progress Note  01/06/2018 11:23 AM Katherine Stephens  MRN:  [id number]  Subjective:   Ms. Katherine Stephens met with treatment team today. She accepted Invega sustenna injection yesterday. She feels sleepy. Denies AH but mumbles under her breath again. She is much more reasonable during our conversation and is able to provide more details. For example, she now says that she returned to [employer] in December of 2017. She therefore qualifies for instate tuition. She seems not to be able to accept that she can't start school right now and needs to wait till the fall.  Treatment plan. We will continue oral Risperdal until next invega injection on Monday.  Social/disposition. She is homeless with limited support. License plates were removed by police, car insurance not paid. What to do?  Principal Problem: Undifferentiated schizophrenia ([place]) Diagnosis:   Patient Active Problem List   Diagnosis Date Noted  . Undifferentiated schizophrenia ([place]) [F20.3] 12/21/2017    Priority: High  . Noncompliance [Z91.19] 12/21/2017  . Schizophreniform disorder ([place]) [F20.81] 10/18/2017  . Auditory hallucinations [R44.0] 10/18/2017  . Poor insight into neurotic condition [F48.9] 10/18/2017  . ASCUS with positive high risk HPV cervical [R87.610, R87.810] 10/07/2017  . Hypertension [I10] 09/29/2017  . Tobacco use disorder [F17.200] 09/23/2017  . Anxiety attack [F41.0] 08/18/2017   Total Time spent with patient: 30 minutes  Past Psychiatric History: bipolar disorder  Past Medical History:  Past Medical History:  Diagnosis Date  . Hypertension   . Schizo affective schizophrenia [facility])     Past Surgical History:  Procedure Laterality Date  . NO PAST SURGERIES     Family History:  Family History  Problem Relation Age of Onset  . Hypertension Mother   . Hypertension Father   . Diabetes Maternal Grandmother    Family Psychiatric  History: none reported Social History:  Social History   Substance and Sexual  Activity  Alcohol Use Yes   Comment: 2 x week     Social History   Substance and Sexual Activity  Drug Use No    Social History   Socioeconomic History  . Marital status: Divorced    Spouse name: None  . Number of children: None  . Years of education: None  . Highest education level: None  Social Needs  . Financial resource strain: None  . Food insecurity - worry: None  . Food insecurity - inability: None  . Transportation needs - medical: None  . Transportation needs - non-medical: None  Occupational History  . None  Tobacco Use  . Smoking status: Current Some Day Smoker    Types: Cigarettes  . Smokeless tobacco: Never Used  . Tobacco comment: smokes cigarettes occasionally. Started smoking at age 50  Substance and Sexual Activity  . Alcohol use: Yes    Comment: 2 x week  . Drug use: No  . Sexual activity: Not Currently    Birth control/protection: None  Other Topics Concern  . None  Social History Narrative  . None   Additional Social History:    History of alcohol / drug use?: No history of alcohol / drug abuse                    Sleep: Fair  Appetite:  Fair  Current Medications: Current Facility-Administered Medications  Medication Dose Route Frequency Provider Last Rate Last Dose  . acetaminophen (TYLENOL) tablet 650 mg  650 mg Oral Q6H PRN Clapacs, Madie Reno, MD   650 mg at 01/06/18 0829  .  alum & mag hydroxide-simeth (MAALOX/MYLANTA) 200-200-20 MG/5ML suspension 30 mL  30 mL Oral Q4H PRN Clapacs, John T, MD      . hydrOXYzine (ATARAX/VISTARIL) tablet 25 mg  25 mg Oral TID PRN Clapacs, John T, MD      . magnesium hydroxide (MILK OF MAGNESIA) suspension 30 mL  30 mL Oral Daily PRN Clapacs, Madie Reno, MD      . Derrill Memo ON 01/09/2018] paliperidone (INVEGA SUSTENNA) injection 156 mg  156 mg Intramuscular Once [place] Tomich B, MD      . paliperidone (INVEGA SUSTENNA) injection 234 mg  234 mg Intramuscular Q28 days Kimmy Totten B, MD   234 mg at  01/05/18 1817  . risperiDONE (RISPERDAL M-TABS) disintegrating tablet 3 mg  3 mg Oral BID Imri Lor B, MD   3 mg at 01/05/18 1800  . temazepam (RESTORIL) capsule 15 mg  15 mg Oral QHS Delynn Pursley B, MD        Lab Results: No results found for this or any previous visit (from the past 48 hour(s)).  Blood Alcohol level:  Lab Results  Component Value Date   ETH <10 01/03/2018   ETH <10 32/95/1884    Metabolic Disorder Labs: Lab Results  Component Value Date   HGBA1C 5.1 01/04/2018   MPG 99.67 01/04/2018   No results found for: PROLACTIN Lab Results  Component Value Date   CHOL 178 01/04/2018   TRIG 79 01/04/2018   HDL 53 01/04/2018   CHOLHDL 3.4 01/04/2018   VLDL 16 01/04/2018   LDLCALC 109 (H) 01/04/2018    Physical Findings: AIMS: Facial and Oral Movements Muscles of Facial Expression: None, normal Lips and Perioral Area: None, normal Jaw: None, normal Tongue: None, normal,Extremity Movements Upper (arms, wrists, hands, fingers): None, normal Lower (legs, knees, ankles, toes): None, normal, Trunk Movements Neck, shoulders, hips: None, normal, Overall Severity Severity of abnormal movements (highest score from questions above): None, normal Incapacitation due to abnormal movements: None, normal Patient's awareness of abnormal movements (rate only patient's report): No Awareness, Dental Status Current problems with teeth and/or dentures?: No Does patient usually wear dentures?: No  CIWA:  CIWA-Ar Total: 2 COWS:  COWS Total Score: 2  Musculoskeletal: Strength & Muscle Tone: within normal limits Gait & Station: normal Patient leans: N/A  Psychiatric Specialty Exam: Physical Exam  Nursing note and vitals reviewed. Psychiatric: Her speech is normal. Her affect is blunt. She is slowed and withdrawn. Thought content is paranoid and delusional. Cognition and memory are normal. She expresses impulsivity.    Review of Systems  Neurological: Negative.    Psychiatric/Behavioral: Positive for hallucinations.  All other systems reviewed and are negative.   Blood pressure 107/74, pulse 90, temperature 98 F (36.7 C), temperature source Oral, resp. rate 18, height 5' 7"  (1.702 m), weight 67.1 kg (148 lb), last menstrual period 12/08/2017, SpO2 94 %.Body mass index is 23.18 kg/m.  General Appearance: Fairly Groomed  Eye Contact:  Fair  Speech:  Clear and Coherent  Volume:  Decreased  Mood:  Depressed  Affect:  Blunt  Thought Process:  Disorganized and Descriptions of Associations: Tangential  Orientation:  Full (Time, Place, and Person)  Thought Content:  Delusions, Hallucinations: Auditory and Paranoid Ideation  Suicidal Thoughts:  No  Homicidal Thoughts:  No  Memory:  Immediate;   Fair Recent;   Fair Remote;   Fair  Judgement:  Poor  Insight:  Lacking  Psychomotor Activity:  Decreased  Concentration:  Concentration: Fair and Attention Span:  Fair  Recall:  Smiley Houseman of Knowledge:  Fair  Language:  Fair  Akathisia:  No  Handed:  Right  AIMS (if indicated):     Assets:  Communication Skills Desire for Improvement Physical Health Resilience Social Support  ADL's:  Intact  Cognition:  WNL  Sleep:  Number of Hours: 7.5     Treatment Plan Summary: Daily contact with patient to assess and evaluate symptoms and progress in treatment and Medication management   Ms. Ghazarian is 32 year old female with no past psychiatric history admitted for psychotic break and deteriorating social functioning.  #Psychosis -continue Risperdal 3 mg BID -continue Invega sustenna injections, next dose of 156 mg on Monday -Trazodone 100 mg nightly -Restoril 15 mg nightly as needed only  #Metabolic syndrome monitoring -Lipid panel, TSH, HgbA1C are normal EKG, QTc 399 -pregnancy test is negative  #Disposition -discharge to her pastor's place -follow up with [facility]    Orson Slick, MD 01/06/2018, 11:23 AM

## 2018-01-06 NOTE — Plan of Care (Signed)
Patient has shown interest in leisure activities when she is feeling well. Patient states that she didn't sleep well last night because of the pain in her right arm after getting her Invega shot yesterday, so she will try and get some rest today. Patient verbalizes understanding of the general information that's been provided to her as well as her treatment/medication regimen without any further questions or concerns at this time. Patient has the ability to make decisions. Patient denies SI/HI/AVH as well as depression and anxiety at this time. Patient has not attended unit groups today because she states that she feels lethargic and wants to rest. Patient is safe on the unit at this time.

## 2018-01-06 NOTE — BHH Group Notes (Signed)

## 2018-01-06 NOTE — Plan of Care (Signed)
Stayed in bed and refused her bed time medication
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
## 2018-01-06 NOTE — Progress Notes (Signed)
[place] present during patient treatment team meeting. Patient stated goal was to get back to taking classes. Patient has support but father is in [place]. [place] asked to follow-up with patient and [place] will provide assistance with programs that will benefit the patient once discharged.

## 2018-01-06 NOTE — Progress Notes (Signed)
Recreation Therapy Notes  Date: 01.18.2019  Time: 9:30 am  Location: Craft Room  Behavioral response: N/A  Intervention Topic: Emotions  Discussion/Intervention: Patient did not attend group. Clinical Observations/Feedback:  Patient did not attend group.          Katherine Stephens 01/06/2018 12:52 PM

## 2018-01-07 NOTE — Progress Notes (Signed)
[facility] MD Progress Note  01/07/2018 12:59 PM Katherine Stephens  MRN:  [id number]  Subjective:   Pt continue to be guarded, paranoid, govern ment watching her, states she has "satellite communication"  She accepted Invega sustenna injection 1/18, reporting pain on inj site, received tylenol. next inj on 1/21..  Treatment plan. We will continue oral Risperdal until next invega injection on Monday.  Social/disposition. She is homeless with limited support. License plates were removed by police, car insurance not paid. What to do?  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 Undifferentiated schizophrenia ([place]) [F20.3] 12/21/2017  . Noncompliance [Z91.19] 12/21/2017  . Schizophreniform disorder ([place]) [F20.81] 10/18/2017  . Auditory hallucinations [R44.0] 10/18/2017  . Poor insight into neurotic condition [F48.9] 10/18/2017  . ASCUS with positive high risk HPV cervical [R87.610, R87.810] 10/07/2017  . Hypertension [I10] 09/29/2017  . Tobacco use disorder [F17.200] 09/23/2017  . Anxiety attack [F41.0] 08/18/2017   Total Time spent with patient: 30 minutes  Past Psychiatric History: bipolar disorder  Past Medical History:  Past Medical History:  Diagnosis Date  . Hypertension   . Schizo affective schizophrenia [facility])     Past Surgical History:  Procedure Laterality Date  . NO PAST SURGERIES     Family History:  Family History  Problem Relation Age of Onset  . Hypertension Mother   . Hypertension Father   . Diabetes Maternal Grandmother    Family Psychiatric  History: none reported Social History:  Social History   Substance and Sexual Activity  Alcohol Use Yes   Comment: 2 x week     Social History   Substance and Sexual Activity  Drug Use No    Social History   Socioeconomic History  . Marital status: Divorced    Spouse name: None  . Number of children: None  . Years of education: None   . Highest education level: None  Social Needs  . Financial resource strain: None  . Food insecurity - worry: None  . Food insecurity - inability: None  . Transportation needs - medical: None  . Transportation needs - non-medical: None  Occupational History  . None  Tobacco Use  . Smoking status: Current Some Day Smoker    Types: Cigarettes  . Smokeless tobacco: Never Used  . Tobacco comment: smokes cigarettes occasionally. Started smoking at age 72  Substance and Sexual Activity  . Alcohol use: Yes    Comment: 2 x week  . Drug use: No  . Sexual activity: Not Currently    Birth control/protection: None  Other Topics Concern  . None  Social History Narrative  . None   Additional Social History:    History of alcohol / drug use?: No history of alcohol / drug abuse                    Sleep: Fair  Appetite:  Fair  Current Medications: Current Facility-Administered Medications  Medication Dose Route Frequency Provider Last Rate Last Dose  . acetaminophen (TYLENOL) tablet 650 mg  650 mg Oral Q6H PRN Clapacs, Madie Reno, MD   650 mg at 01/07/18 0902  . alum & mag hydroxide-simeth (MAALOX/MYLANTA) 200-200-20 MG/5ML suspension 30 mL  30 mL Oral Q4H PRN Clapacs, John T, MD      . hydrOXYzine (ATARAX/VISTARIL) tablet 25 mg  25 mg Oral TID PRN Clapacs, John T, MD      . magnesium hydroxide (MILK OF MAGNESIA)  suspension 30 mL  30 mL Oral Daily PRN Clapacs, Madie Reno, MD      . Derrill Memo ON 01/09/2018] paliperidone (INVEGA SUSTENNA) injection 156 mg  156 mg Intramuscular Once Pucilowska, Jolanta B, MD      . paliperidone (INVEGA SUSTENNA) injection 234 mg  234 mg Intramuscular Q28 days Pucilowska, Jolanta B, MD   234 mg at 01/05/18 1817  . risperiDONE (RISPERDAL M-TABS) disintegrating tablet 3 mg  3 mg Oral BID Pucilowska, Jolanta B, MD   3 mg at 01/07/18 0859  . temazepam (RESTORIL) capsule 15 mg  15 mg Oral QHS PRN Pucilowska, Jolanta B, MD      . traZODone (DESYREL) tablet 100 mg  100  mg Oral QHS Pucilowska, Jolanta B, MD   100 mg at 01/06/18 2139    Lab Results: No results found for this or any previous visit (from the past 48 hour(s)).  Blood Alcohol level:  Lab Results  Component Value Date   ETH <10 01/03/2018   ETH <10 12/45/8099    Metabolic Disorder Labs: Lab Results  Component Value Date   HGBA1C 5.1 01/04/2018   MPG 99.67 01/04/2018   No results found for: PROLACTIN Lab Results  Component Value Date   CHOL 178 01/04/2018   TRIG 79 01/04/2018   HDL 53 01/04/2018   CHOLHDL 3.4 01/04/2018   VLDL 16 01/04/2018   LDLCALC 109 (H) 01/04/2018    Physical Findings: AIMS: Facial and Oral Movements Muscles of Facial Expression: None, normal Lips and Perioral Area: None, normal Jaw: None, normal Tongue: None, normal,Extremity Movements Upper (arms, wrists, hands, fingers): None, normal Lower (legs, knees, ankles, toes): None, normal, Trunk Movements Neck, shoulders, hips: None, normal, Overall Severity Severity of abnormal movements (highest score from questions above): None, normal Incapacitation due to abnormal movements: None, normal Patient's awareness of abnormal movements (rate only patient's report): No Awareness, Dental Status Current problems with teeth and/or dentures?: No Does patient usually wear dentures?: No  CIWA:  CIWA-Ar Total: 2 COWS:  COWS Total Score: 2  Musculoskeletal: Strength & Muscle Tone: within normal limits Gait & Station: normal Patient leans: N/A  Psychiatric Specialty Exam: Physical Exam  Nursing note and vitals reviewed. Psychiatric: Her speech is normal. Her affect is blunt. She is slowed and withdrawn. Thought content is paranoid and delusional. Cognition and memory are normal. She expresses impulsivity.    Review of Systems  Neurological: Negative.   Psychiatric/Behavioral: Positive for hallucinations.  All other systems reviewed and are negative.   Blood pressure 125/70, pulse 80, temperature 98.2 F  (36.8 C), temperature source Oral, resp. rate 18, height 5\' 7"  (1.702 m), weight 67.1 kg (148 lb), last menstrual period 12/08/2017, SpO2 94 %.Body mass index is 23.18 kg/m.  General Appearance: Fairly Groomed  Eye Contact:  Fair  Speech:  Clear and Coherent  Volume:  Decreased  Mood:  Depressed  Affect:  Blunt  Thought Process:  Disorganized and Descriptions of Associations: Tangential  Orientation:  Full (Time, Place, and Person)  Thought Content:  Delusions, Hallucinations: Auditory and Paranoid Ideation, delusional about satellite comminication  Suicidal Thoughts:  No  Homicidal Thoughts:  No  Memory:  Immediate;   Fair Recent;   Fair Remote;   Fair  Judgement:  Poor  Insight:  Lacking  Psychomotor Activity:  Decreased  Concentration:  Concentration: Fair and Attention Span: Fair  Recall:  [employer] of Knowledge:  Fair  Language:  Fair  Akathisia:  No  Handed:  Right  AIMS (if indicated):     Assets:  Communication Skills Desire for Improvement Physical Health Resilience Social Support  ADL's:  Intact  Cognition:  WNL  Sleep:  Number of Hours: 8.75     Treatment Plan Summary: Daily contact with patient to assess and evaluate symptoms and progress in treatment and Medication management   Ms. Bellantoni is 32 year old female with no past psychiatric history admitted for psychotic break and deteriorating social functioning. Pt still psychotic.   #Psychosis -continue Risperdal 3 mg BID -continue Invega sustenna injections, next dose of 156 mg on Monday -Trazodone 100 mg nightly -Restoril 15 mg nightly as needed only  #Metabolic syndrome monitoring -Lipid panel, TSH, HgbA1C are normal EKG, QTc 399 -pregnancy test is negative  #Disposition -discharge to her pastor's place -follow up with [facility]    Lenward Chancellor, MD 01/07/2018, 12:59 PMPatient ID: Katherine Stephens, female   DOB: 1986/03/01, 40 y.o.   MRN: [id number]

## 2018-01-07 NOTE — Progress Notes (Signed)
Patient found asleep in bed upon my arrival. Patient is neither visible nor social throughout the evening. Patient remains isolative to her room throughout the evening. Denies SI, HI, AVH, depression, and anxiety. Reports eating, sleeping, and voiding adequately. Minimally verbal throughout encounters. Denies pain. Compliant with HS medications. Refused snack. Did not attend group. Q 15 minute checks maintained. Will continue to monitor throughout the shift.

## 2018-01-07 NOTE — [facility] Group Notes (Signed)
LCSW Group Therapy Note   01/07/2018 1:15pm   Type of Therapy and Topic:  Group Therapy:  Trust and Honesty  Participation Level:  None  Description of Group:    In this group patients will be asked to explore the value of being honest.  Patients will be guided to discuss their thoughts, feelings, and behaviors related to honesty and trusting in others. Patients will process together how trust and honesty relate to forming relationships with peers, family members, and self. Each patient will be challenged to identify and express feelings of being vulnerable. Patients will discuss reasons why people are dishonest and identify alternative outcomes if one was truthful (to self or others). This group will be process-oriented, with patients participating in exploration of their own experiences, giving and receiving support, and processing challenge from other group members.   Therapeutic Goals: 1. Patient will identify why honesty is important to relationships and how honesty overall affects relationships.  2. Patient will identify a situation where they lied or were lied too and the  feelings, thought process, and behaviors surrounding the situation 3. Patient will identify the meaning of being vulnerable, how that feels, and how that correlates to being honest with self and others. 4. Patient will identify situations where they could have told the truth, but instead lied and explain reasons of dishonesty.   Summary of Patient Progress: Pt attended group but did not participate.     Therapeutic Modalities:   Cognitive Behavioral Therapy Solution Focused Therapy Motivational Interviewing Brief Therapy  Aqsa Sensabaugh  CUEBAS-COLON, LCSW 01/07/2018 12:56 PM

## 2018-01-07 NOTE — Plan of Care (Signed)
Patient is alert and oriented X 4. Patient denies SI, HI and AVH. Patient states she is very sleepy today. Patient complains of pain in right arm due to an injection she received two days ago. Patient given tylenol to help manage pain. Patient is compliant with medications, is appropriate to staff and peers on the unit. Vital signs are within normal limits. Nurse will continue to monitor. Safety checks Q 15 minutes. Activity: Interest or engagement in leisure activities will improve 01/07/2018 1219 - Not Progressing by Geraldo Docker, RN Imbalance in normal sleep/wake cycle will improve 01/07/2018 1219 - Progressing by Geraldo Docker, RN   Education: Utilization of techniques to improve thought processes will improve 01/07/2018 1219 - Progressing by Geraldo Docker, RN Knowledge of the prescribed therapeutic regimen will improve 01/07/2018 1219 - Progressing by Geraldo Docker, RN   Coping: Ability to cope will improve 01/07/2018 1219 - Progressing by Geraldo Docker, RN Ability to verbalize feelings will improve 01/07/2018 1219 - Progressing by Geraldo Docker, RN

## 2018-01-07 NOTE — Plan of Care (Signed)
  Progressing Education: Knowledge of the prescribed therapeutic regimen will improve 01/07/2018 0043 - Progressing by Derek Mound, RN

## 2018-01-08 MED ORDER — PALIPERIDONE PALMITATE 156 MG/ML IM SUSP
156.0000 mg | Freq: Once | INTRAMUSCULAR | Status: AC
  Administered 2018-01-09: 156 mg via INTRAMUSCULAR
  Filled 2018-01-08: qty 1

## 2018-01-08 NOTE — Progress Notes (Signed)
[facility] MD Progress Note  01/08/2018 11:29 AM Katherine Stephens  MRN:  [id number]  Subjective:   Pt continue to be guarded, states she has "satellite communication" with the president of the [place] and his son in law , about the county"   Next invega sustenna inj on 1/21.  slept well.  Treatment plan. We will continue oral Risperdal until next invega injection on Monday.  Social/disposition. She is homeless with limited support. License plates were removed by police, car insurance not paid. What to do?  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 Undifferentiated schizophrenia ([place]) [F20.3] 12/21/2017  . Noncompliance [Z91.19] 12/21/2017  . Schizophreniform disorder ([place]) [F20.81] 10/18/2017  . Auditory hallucinations [R44.0] 10/18/2017  . Poor insight into neurotic condition [F48.9] 10/18/2017  . ASCUS with positive high risk HPV cervical [R87.610, R87.810] 10/07/2017  . Hypertension [I10] 09/29/2017  . Tobacco use disorder [F17.200] 09/23/2017  . Anxiety attack [F41.0] 08/18/2017   Total Time spent with patient: 25 min  Past Psychiatric History: bipolar disorder  Past Medical History:  Past Medical History:  Diagnosis Date  . Hypertension   . Schizo affective schizophrenia [facility])     Past Surgical History:  Procedure Laterality Date  . NO PAST SURGERIES     Family History:  Family History  Problem Relation Age of Onset  . Hypertension Mother   . Hypertension Father   . Diabetes Maternal Grandmother    Family Psychiatric  History: none reported Social History:  Social History   Substance and Sexual Activity  Alcohol Use Yes   Comment: 2 x week     Social History   Substance and Sexual Activity  Drug Use No    Social History   Socioeconomic History  . Marital status: Divorced    Spouse name: None  . Number of children: None  . Years of education: None  . Highest education  level: None  Social Needs  . Financial resource strain: None  . Food insecurity - worry: None  . Food insecurity - inability: None  . Transportation needs - medical: None  . Transportation needs - non-medical: None  Occupational History  . None  Tobacco Use  . Smoking status: Current Some Day Smoker    Types: Cigarettes  . Smokeless tobacco: Never Used  . Tobacco comment: smokes cigarettes occasionally. Started smoking at age 42  Substance and Sexual Activity  . Alcohol use: Yes    Comment: 2 x week  . Drug use: No  . Sexual activity: Not Currently    Birth control/protection: None  Other Topics Concern  . None  Social History Narrative  . None   Additional Social History:    History of alcohol / drug use?: No history of alcohol / drug abuse                    Sleep: Fair  Appetite:  Fair  Current Medications: Current Facility-Administered Medications  Medication Dose Route Frequency Provider Last Rate Last Dose  . acetaminophen (TYLENOL) tablet 650 mg  650 mg Oral Q6H PRN Clapacs, Madie Reno, MD   650 mg at 01/08/18 0913  . alum & mag hydroxide-simeth (MAALOX/MYLANTA) 200-200-20 MG/5ML suspension 30 mL  30 mL Oral Q4H PRN Clapacs, John T, MD      . hydrOXYzine (ATARAX/VISTARIL) tablet 25 mg  25 mg Oral TID PRN Clapacs, Madie Reno, MD      . magnesium  hydroxide (MILK OF MAGNESIA) suspension 30 mL  30 mL Oral Daily PRN Clapacs, Madie Reno, MD      . Derrill Memo ON 01/09/2018] paliperidone (INVEGA SUSTENNA) injection 156 mg  156 mg Intramuscular Once Pucilowska, Jolanta B, MD      . paliperidone (INVEGA SUSTENNA) injection 234 mg  234 mg Intramuscular Q28 days Pucilowska, Jolanta B, MD   234 mg at 01/05/18 1817  . risperiDONE (RISPERDAL M-TABS) disintegrating tablet 3 mg  3 mg Oral BID Pucilowska, Jolanta B, MD   3 mg at 01/08/18 0900  . temazepam (RESTORIL) capsule 15 mg  15 mg Oral QHS PRN Pucilowska, Jolanta B, MD      . traZODone (DESYREL) tablet 100 mg  100 mg Oral QHS  Pucilowska, Jolanta B, MD   100 mg at 01/06/18 2139    Lab Results: No results found for this or any previous visit (from the past 48 hour(s)).  Blood Alcohol level:  Lab Results  Component Value Date   ETH <10 01/03/2018   ETH <10 12/45/8099    Metabolic Disorder Labs: Lab Results  Component Value Date   HGBA1C 5.1 01/04/2018   MPG 99.67 01/04/2018   No results found for: PROLACTIN Lab Results  Component Value Date   CHOL 178 01/04/2018   TRIG 79 01/04/2018   HDL 53 01/04/2018   CHOLHDL 3.4 01/04/2018   VLDL 16 01/04/2018   LDLCALC 109 (H) 01/04/2018    Physical Findings: AIMS: Facial and Oral Movements Muscles of Facial Expression: None, normal Lips and Perioral Area: None, normal Jaw: None, normal Tongue: None, normal,Extremity Movements Upper (arms, wrists, hands, fingers): None, normal Lower (legs, knees, ankles, toes): None, normal, Trunk Movements Neck, shoulders, hips: None, normal, Overall Severity Severity of abnormal movements (highest score from questions above): None, normal Incapacitation due to abnormal movements: None, normal Patient's awareness of abnormal movements (rate only patient's report): No Awareness, Dental Status Current problems with teeth and/or dentures?: No Does patient usually wear dentures?: No  CIWA:  CIWA-Ar Total: 2 COWS:  COWS Total Score: 2  Musculoskeletal: Strength & Muscle Tone: within normal limits Gait & Station: normal Patient leans: N/A  Psychiatric Specialty Exam: Physical Exam  Nursing note and vitals reviewed. Psychiatric: Her speech is normal. Her affect is blunt. She is slowed and withdrawn. Thought content is paranoid and delusional. Cognition and memory are normal. She expresses impulsivity.    Review of Systems  Neurological: Negative.   Psychiatric/Behavioral: Positive for hallucinations.  All other systems reviewed and are negative.   Blood pressure 109/70, pulse 86, temperature 98.2 F (36.8 C),  temperature source Oral, resp. rate 18, height 5\' 7"  (1.702 m), weight 67.1 kg (148 lb), last menstrual period 12/08/2017, SpO2 94 %.Body mass index is 23.18 kg/m.  General Appearance: Fairly Groomed  Eye Contact:  Fair  Speech:  Clear and Coherent  Volume:  Decreased  Mood:  Depressed  Affect:  Blunt  Thought Process:  Disorganized and Descriptions of Associations: Tangential  Orientation:  Full (Time, Place, and Person)  Thought Content:  Delusions, Hallucinations: Auditory and Paranoid Ideation, delusional about satellite comminication  Suicidal Thoughts:  No  Homicidal Thoughts:  No  Memory:  Immediate;   Fair Recent;   Fair Remote;   Fair  Judgement:  Poor  Insight:  Lacking  Psychomotor Activity:  Decreased  Concentration:  Concentration: Fair and Attention Span: Fair  Recall:  [employer] of Knowledge:  Fair  Language:  Fair  Akathisia:  No  Handed:  Right  AIMS (if indicated):     Assets:  Communication Skills Desire for Improvement Physical Health Resilience Social Support  ADL's:  Intact  Cognition:  WNL  Sleep:  Number of Hours: 8.75     Treatment Plan Summary: Daily contact with patient to assess and evaluate symptoms and progress in treatment and Medication management   Ms. Grein is 32 year old female with no past psychiatric history admitted for psychotic break and deteriorating social functioning. Pt still psychotic, delusional.   #Psychosis -continue Risperdal 3 mg BID -continue Invega sustenna injections, next dose of 156 mg on Monday -Trazodone 100 mg nightly -Restoril 15 mg nightly as needed only  #Metabolic syndrome monitoring -Lipid panel, TSH, HgbA1C are normal EKG, QTc 399 -pregnancy test is negative  #Disposition -discharge to her pastor's place -follow up with [facility]    Lenward Chancellor, MD 01/08/2018, 11:29 AMPatient ID: Katherine Stephens, female   DOB: Apr 24, 1986, 41 y.o.   MRN: [id number] Patient ID: Katherine Stephens,  female   DOB: [birth year], 12 y.o.   MRN: [id number]

## 2018-01-08 NOTE — Plan of Care (Signed)
Pt. Reports sleeping, "good". Pt. Verbalizes understanding of provided education of medications, but not compliant this evening with evening medications. Pt continues to be isolative and not attend groups. Pt. Continues to fixate on the, "government is watching me".  Activity: Interest or engagement in leisure activities will improve 01/08/2018 0208 - Not Progressing by Reyes Ivan, RN   Health Behavior/Discharge Planning: Compliance with therapeutic regimen will improve 01/08/2018 0208 - Not Progressing by Reyes Ivan, RN   Education: Will be free of psychotic symptoms 01/08/2018 0208 - Not Progressing by Reyes Ivan, RN   Health Behavior/Discharge Planning: Compliance with prescribed medication regimen will improve 01/08/2018 0208 - Not Progressing by Reyes Ivan, RN   [facility] Participation in Recreation Therapeutic Interventions STG-Other Recreation Therapy Goal (Specify) Description Patient will participate actively in groups and activities x5 days.  01/08/2018 0208 - Not Progressing by Reyes Ivan, RN   Activity: Imbalance in normal sleep/wake cycle will improve 01/08/2018 0208 - Progressing by Reyes Ivan, RN   Education: Knowledge of the prescribed therapeutic regimen will improve 01/08/2018 0208 - Progressing by Reyes Ivan, RN   Education: Knowledge of the prescribed therapeutic regimen will improve 01/08/2018 0208 - Progressing by Reyes Ivan, RN

## 2018-01-08 NOTE — Progress Notes (Signed)
D:Pt denies SI/HI/AVH. Pt is assertive upon interaction with this writer, but pleasant and reports no complaints this evening. Pt. Verbally contracts for safety. Pt. States she can remain safe while on the unit. Pt. When asked about government interaction in her life continues to fixate on paranoid comments such as, "government is in my life". Pt. Reports sleeping and eating, "good". Pt. Reports feeling, "the same" today as she did yesterday.   A: Q x 15 minute observation checks were completed for safety. Patient was provided with education and verbalizes understanding, but reports she does not need evening medications to fall asleep, so she refused her Trazodone. Patient was attempted to be given scheduled medications, but continued to refused. Patient was encourage to attend groups, participate in unit activities and continue with plan of care. Pt. Was encouraged to comply with medication regimen this evening. Will update treatment team in the morning during hand-off report. Will continue to encourage compliance with medications.    R:Patient this evening is not compliant with medications and is very isolative, however did Attend groups and unit activities today.           Patient slept for Estimated Hours of 7.15; Precautionary checks every 15 minutes for safety maintained, room free of safety hazards, patient sustains no injury or falls during this shift.

## 2018-01-08 NOTE — Plan of Care (Signed)
Patient has been observed out in the milieu more so this afternoon than this morning. Patient has been interacting with other members on the unit as well as reading the newspaper after dinner. Patient states that she slept "pretty good" last night. Patient verbalizes understanding of her therapeutic/medication regimen and does not voice any further questions or concerns at this time. Patient denies SI/HI/AVH as well as depression and anxiety at this time. Patient has participated in unit groups and is safe at this time.

## 2018-01-08 NOTE — [facility] Group Notes (Signed)
LCSW Group Therapy Note 01/08/2018 1:15pm  Type of Therapy and Topic: Group Therapy: Feelings Around Returning Home & Establishing a Supportive Framework and Supporting Oneself When Supports Not Available  Participation Level: Active  Description of Group:  Patients first processed thoughts and feelings about upcoming discharge. These included fears of upcoming changes, lack of change, new living environments, judgements and expectations from others and overall stigma of mental health issues. The group then discussed the definition of a supportive framework, what that looks and feels like, and how do to discern it from an unhealthy non-supportive network. The group identified different types of supports as well as what to do when your family/friends are less than helpful or unavailable  Therapeutic Goals  1. Patient will identify one healthy supportive network that they can use at discharge. 2. Patient will identify one factor of a supportive framework and how to tell it from an unhealthy network. 3. Patient able to identify one coping skill to use when they do not have positive supports from others. 4. Patient will demonstrate ability to communicate their needs through discussion and/or role plays.  Summary of Patient Progress:  Pt scored her mood 8 (10 best). Pt engaged during group session. As patients processed their anxiety about discharge and described healthy supports patient shared she has been feeling tired lately but she is ready to go back home. Patients identified at least one self-care tool they were willing to use after discharge.   Therapeutic Modalities Cognitive Behavioral Therapy Motivational Interviewing   Arbadella Kimbler  CUEBAS-COLON, LCSW 01/08/2018 11:14 AM

## 2018-01-08 NOTE — Plan of Care (Signed)
Pt. Attends groups and unit activities. Pt. Reports sleeping, "good". Pt. Verbalizes education provided and medication regimen knowledge. Pt. Not compliant this evening with evening trazodone. Pt. Continues to express paranoia to this Probation officer about government involvement in her mind. Education provided to encourage compliance with prescribed medication regimen. Will continue to encourage compliance. Will update treatment team of non-compliance with evening trazodone.   Progressing Activity: Interest or engagement in leisure activities will improve 01/08/2018 2239 - Progressing by Reyes Ivan, RN 01/08/2018 2235 - Progressing by Reyes Ivan, RN Imbalance in normal sleep/wake cycle will improve 01/08/2018 2239 - Progressing by Reyes Ivan, RN 01/08/2018 2235 - Progressing by Reyes Ivan, RN Education: Knowledge of the prescribed therapeutic regimen will improve 01/08/2018 2239 - Progressing by Reyes Ivan, RN 01/08/2018 2235 - Progressing by Reyes Ivan, RN Education: Knowledge of the prescribed therapeutic regimen will improve 01/08/2018 2239 - Progressing by Reyes Ivan, RN 01/08/2018 2235 - Progressing by Reyes Ivan, RN [facility] Participation in Recreation Therapeutic Interventions STG-Other Recreation Therapy Goal [facility]) Description Patient will participate actively in groups and activities x5 days.  01/08/2018 2239 - Progressing by Reyes Ivan, RN 01/08/2018 2235 - Progressing by Reyes Ivan, RN

## 2018-01-08 NOTE — Plan of Care (Deleted)
Pt. Attends groups and unit activities. Pt. Reports sleeping, "good". Pt. Verbalizes education provided and medication regimen knowledge. Pt. Not compliant this evening with evening trazodone. Pt. Continues to express paranoia to this Probation officer about government involvement in her mind. Education provided to encourage compliance with prescribed medication regimen. Will continue to encourage compliance. Will update treatment team of non-compliance with evening trazodone.

## 2018-01-08 NOTE — Progress Notes (Signed)
D- Patient alert and oriented. Patient presents in a pleasant mood on assessment stating that she "slept pretty good" last night with complaints on pain in her right arm stating that her pain level is "5/10" from getting her Invega shot. Patient denies SI, HI, AVH, at this time. Patient also denies depression and anxiety at this time. Patient's goal for today is "getting out tomorrow morning", which she will accomplish by "interact with others".  A- Scheduled medications administered to patient, per MD orders. Support and encouragement provided.  Routine safety checks conducted every 15 minutes.  Patient informed to notify staff with problems or concerns.  R- No adverse drug reactions noted. Patient contracts for safety at this time. Patient compliant with medications and treatment plan. Patient receptive, calm, and cooperative. Patient interacts well with others on the unit.  Patient remains safe at this time.

## 2018-01-08 NOTE — Progress Notes (Signed)
D:Pt denies SI/HI/AVH. Pt is guarded upon interaction with this writer, but pleasant and reports no complaints this evening. Pt. Verbally contracts for safety. Pt. States she can remain safe while on the unit. Pt. When asked about government interaction in her life continues to fixate on paranoid comments such as, "government is watching me". Pt. Reports sleeping and eating, "good". Pt. Reports feeling, "the same" today as she did yesterday.   A: Q x 15 minute observation checks were completed for safety. Patient was provided with education and verbalizes understanding, but reports she does not need evening medications to fall asleep, so she refused. Patient was attempted to be given scheduled medications, but continued to refused. Patient  was encourage to attend groups, participate in unit activities and continue with plan of care. Pt. Was encouraged to comply with medication regimen this evening. Will update treatment team in the morning during hand-off report.     R:Patient this evening is not compliant with medications and is very isolative. Pt. Does not attend groups.              Patient slept for Estimated Hours of 6; Precautionary checks every 15 minutes for safety maintained, room free of safety hazards, patient sustains no injury or falls during this shift.

## 2018-01-09 NOTE — Progress Notes (Addendum)
Patient found in bed sleeping upon my arrival. Patient is isolative throughout the evening. Refused snack. Reports Katherine Stephens feels tired. Denies SI, HI, AVH, and paranoia. Denies depression and anxiety. States, "I'm just tired." Q 15 minutes checks maintained. Will continue to monitor throughout the shift. Patient remained in bed throughout the shift. Compliant with am vitals. Will endorse care to oncoming shift.

## 2018-01-09 NOTE — [facility] Group Notes (Signed)
[place] Group Notes:  (Nursing/MHT/Case Management/Adjunct)  Date:  01/09/2018  Time:  3:07 PM  Type of Therapy:  Psychoeducational Skills  Participation Level:  Active  Participation Quality:  Appropriate, Attentive and Sharing  Affect:  Appropriate  Cognitive:  Appropriate  Insight:  Appropriate  Engagement in Group:  Engaged  Modes of Intervention:  Discussion, Education and Support  Summary of Progress/Problems:  Katherine Stephens 01/09/2018, 3:07 PM

## 2018-01-09 NOTE — [facility] Group Notes (Signed)
01/09/2018 9:30AM  Type of Therapy and Topic:  Group Therapy:  Overcoming Obstacles  Participation Level:  Did Not Attend    Description of Group:    In this group patients will be encouraged to explore what they see as obstacles to their own wellness and recovery. They will be guided to discuss their thoughts, feelings, and behaviors related to these obstacles. The group will process together ways to cope with barriers, with attention given to specific choices patients can make. Each patient will be challenged to identify changes they are motivated to make in order to overcome their obstacles. This group will be process-oriented, with patients participating in exploration of their own experiences as well as giving and receiving support and challenge from other group members.   Therapeutic Goals: 1. Patient will identify personal and current obstacles as they relate to admission. 2. Patient will identify barriers that currently interfere with their wellness or overcoming obstacles.  3. Patient will identify feelings, thought process and behaviors related to these barriers. 4. Patient will identify two changes they are willing to make to overcome these obstacles:      Summary of Patient Progress Patient was encouraged and invited to attend group. Patient did not attend group. Social worker will continue to encourage group participation in the future.     Therapeutic Modalities:   Cognitive Behavioral Therapy Solution Focused Therapy Motivational Interviewing Relapse Prevention Therapy    Darin Engels MSW, [place] 01/09/2018 10:34 AM

## 2018-01-09 NOTE — Plan of Care (Signed)
  Progressing Coping: Ability to cope will improve 01/09/2018 2204 - Progressing by Derek Mound, RN Ability to verbalize feelings will improve 01/09/2018 2204 - Progressing by Derek Mound, RN Self-Concept: Level of anxiety will decrease 01/09/2018 2204 - Progressing by Derek Mound, RN

## 2018-01-09 NOTE — Progress Notes (Addendum)
[facility] MD Progress Note  01/09/2018 2:11 PM Katherine Stephens  MRN:  [id number]  Subjective:   Katherine Stephens denies any symptoms of depression, anxiety or psychosis. She however was quite paranoid over the weekend. She reluctantly took second dose of Invega sustenna today and expects to be discharged immediately. She is still preoccupied with school she believes she could start tomorrow.  Spoke with the father who lives in [place]. Very supportive investigating housing options.   Treatment plan. We will continue oral and injectable Invega sustenna.  Social/disposition. She is homeless and has no support.   Principal Problem: Undifferentiated schizophrenia ([place]) Diagnosis:   Patient Active Problem List   Diagnosis Date Noted  . Undifferentiated schizophrenia ([place]) [F20.3] 12/21/2017    Priority: High  . Noncompliance [Z91.19] 12/21/2017  . Schizophreniform disorder ([place]) [F20.81] 10/18/2017  . Auditory hallucinations [R44.0] 10/18/2017  . Poor insight into neurotic condition [F48.9] 10/18/2017  . ASCUS with positive high risk HPV cervical [R87.610, R87.810] 10/07/2017  . Hypertension [I10] 09/29/2017  . Tobacco use disorder [F17.200] 09/23/2017  . Anxiety attack [F41.0] 08/18/2017   Total Time spent with patient: 20 minutes  Past Psychiatric History: bipolar disorder  Past Medical History:  Past Medical History:  Diagnosis Date  . Hypertension   . Schizo affective schizophrenia [facility])     Past Surgical History:  Procedure Laterality Date  . NO PAST SURGERIES     Family History:  Family History  Problem Relation Age of Onset  . Hypertension Mother   . Hypertension Father   . Diabetes Maternal Grandmother    Family Psychiatric  History: none reported+  Social History:  Social History   Substance and Sexual Activity  Alcohol Use Yes   Comment: 2 x week     Social History   Substance and Sexual Activity  Drug Use No    Social History   Socioeconomic History   . Marital status: Divorced    Spouse name: None  . Number of children: None  . Years of education: None  . Highest education level: None  Social Needs  . Financial resource strain: None  . Food insecurity - worry: None  . Food insecurity - inability: None  . Transportation needs - medical: None  . Transportation needs - non-medical: None  Occupational History  . None  Tobacco Use  . Smoking status: Current Some Day Smoker    Types: Cigarettes  . Smokeless tobacco: Never Used  . Tobacco comment: smokes cigarettes occasionally. Started smoking at age 80  Substance and Sexual Activity  . Alcohol use: Yes    Comment: 2 x week  . Drug use: No  . Sexual activity: Not Currently    Birth control/protection: None  Other Topics Concern  . None  Social History Narrative  . None   Additional Social History:    History of alcohol / drug use?: No history of alcohol / drug abuse                    Sleep: Fair  Appetite:  Fair  Current Medications: Current Facility-Administered Medications  Medication Dose Route Frequency Provider Last Rate Last Dose  . acetaminophen (TYLENOL) tablet 650 mg  650 mg Oral Q6H PRN Clapacs, Madie Reno, MD   650 mg at 01/09/18 1348  . alum & mag hydroxide-simeth (MAALOX/MYLANTA) 200-200-20 MG/5ML suspension 30 mL  30 mL Oral Q4H PRN Clapacs, John T, MD      . hydrOXYzine (ATARAX/VISTARIL) tablet 25 mg  25 mg Oral TID PRN Clapacs, John T, MD      . magnesium hydroxide (MILK OF MAGNESIA) suspension 30 mL  30 mL Oral Daily PRN Clapacs, John T, MD      . paliperidone (INVEGA SUSTENNA) injection 234 mg  234 mg Intramuscular Q28 days Sanna Porcaro B, MD   234 mg at 01/05/18 1817  . risperiDONE (RISPERDAL M-TABS) disintegrating tablet 3 mg  3 mg Oral BID Layne Dilauro B, MD   3 mg at 01/09/18 0844  . temazepam (RESTORIL) capsule 15 mg  15 mg Oral QHS PRN Alassane Kalafut B, MD      . traZODone (DESYREL) tablet 100 mg  100 mg Oral QHS  Omolara Carol B, MD   100 mg at 01/06/18 2139    Lab Results: No results found for this or any previous visit (from the past 48 hour(s)).  Blood Alcohol level:  Lab Results  Component Value Date   ETH <10 01/03/2018   ETH <10 70/26/3785    Metabolic Disorder Labs: Lab Results  Component Value Date   HGBA1C 5.1 01/04/2018   MPG 99.67 01/04/2018   No results found for: PROLACTIN Lab Results  Component Value Date   CHOL 178 01/04/2018   TRIG 79 01/04/2018   HDL 53 01/04/2018   CHOLHDL 3.4 01/04/2018   VLDL 16 01/04/2018   LDLCALC 109 (H) 01/04/2018    Physical Findings: AIMS: Facial and Oral Movements Muscles of Facial Expression: None, normal Lips and Perioral Area: None, normal Jaw: None, normal Tongue: None, normal,Extremity Movements Upper (arms, wrists, hands, fingers): None, normal Lower (legs, knees, ankles, toes): None, normal, Trunk Movements Neck, shoulders, hips: None, normal, Overall Severity Severity of abnormal movements (highest score from questions above): None, normal Incapacitation due to abnormal movements: None, normal Patient's awareness of abnormal movements (rate only patient's report): No Awareness, Dental Status Current problems with teeth and/or dentures?: No Does patient usually wear dentures?: No  CIWA:  CIWA-Ar Total: 2 COWS:  COWS Total Score: 2  Musculoskeletal: Strength & Muscle Tone: within normal limits Gait & Station: normal Patient leans: N/A  Psychiatric Specialty Exam: Physical Exam  Nursing note and vitals reviewed. Psychiatric: She has a normal mood and affect. Her speech is normal and behavior is normal. Thought content is paranoid and delusional. Cognition and memory are normal. She expresses impulsivity.    Review of Systems  Neurological: Negative.   Psychiatric/Behavioral: Positive for hallucinations.  All other systems reviewed and are negative.   Blood pressure 123/79, pulse 73, temperature 98.2 F (36.8  C), temperature source Oral, resp. rate 18, height 5\' 7"  (1.702 m), weight 67.1 kg (148 lb), last menstrual period 12/08/2017, SpO2 94 %.Body mass index is 23.18 kg/m.  General Appearance: Fairly Groomed  Eye Contact:  Good  Speech:  Normal Rate  Volume:  Normal  Mood:  Dysphoric  Affect:  Blunt  Thought Process:  Goal Directed and Descriptions of Associations: Intact  Orientation:  Full (Time, Place, and Person)  Thought Content:  Delusions, Hallucinations: Auditory and Paranoid Ideation  Suicidal Thoughts:  No  Homicidal Thoughts:  No  Memory:  Immediate;   Fair Recent;   Fair Remote;   Fair  Judgement:  Poor  Insight:  Lacking  Psychomotor Activity:  Decreased  Concentration:  Concentration: Fair and Attention Span: Fair  Recall:  [employer] of Knowledge:  Fair  Language:  Fair  Akathisia:  No  Handed:  Right  AIMS (if indicated):  Assets:  Communication Skills Desire for Improvement Physical Health Resilience Social Support  ADL's:  Intact  Cognition:  WNL  Sleep:  Number of Hours: 8.75     Treatment Plan Summary: Daily contact with patient to assess and evaluate symptoms and progress in treatment and Medication management   Katherine Stephens is 32 year old female with no past psychiatric history admitted for psychotic break and deteriorating social functioning. Pt still psychotic, delusional.   #Psychosis. improving -continue Risperdal 3 mgBID -continue Invega sustenna injections, next dose o2/15/2019 -Trazodone 100 mg nightly -Restoril 15 mg nightly as needed only  #Metabolic syndrome monitoring -Lipid panel, TSH, HgbA1C are normal EKG, QTc 399 -pregnancy test is negative  #Disposition -discharge to her pastor's place -follow up with [facility]     Orson Slick, MD 01/09/2018, 2:11 PM

## 2018-01-09 NOTE — Progress Notes (Signed)
Recreation Therapy Notes   Date: 01.21.2019  Time: 1:00 PM  Location: Craft Room  Behavioral response:N/A  Intervention Topic: Goals  Discussion/Intervention: Patient did not attend group.  Clinical Observations/Feedback:  Patient did not attend group.   Lillee Mooneyhan LRT/CTRS           Tahiry Spicer 01/09/2018 2:20 PM

## 2018-01-10 NOTE — [facility] Group Notes (Signed)
01/10/2018 0930  Type of Therapy/Topic:  Group Therapy:  Feelings about Diagnosis  Participation Level:  Minimal   Description of Group:   This group will allow patients to explore their thoughts and feelings about diagnoses they have received. Patients will be guided to explore their level of understanding and acceptance of these diagnoses. Facilitator will encourage patients to process their thoughts and feelings about the reactions of others to their diagnosis and will guide patients in identifying ways to discuss their diagnosis with significant others in their lives. This group will be process-oriented, with patients participating in exploration of their own experiences, giving and receiving support, and processing challenge from other group members.   Therapeutic Goals: 1. Patient will demonstrate understanding of diagnosis as evidenced by identifying two or more symptoms of the disorder 2. Patient will be able to express two feelings regarding the diagnosis 3. Patient will demonstrate their ability to communicate their needs through discussion and/or role play  Summary of Patient Progress: Pt attended group but did not participate in group discussions. Pt participated in the check in activity and reported, "I am feeling lethargic today because I'm going to be here longer than I thought." Pt was then observed sleeping during group conversation. At times, pt was observed laughing inappropriately but immediately went back to sleep. Pt will continue to work towards her tx goals.   Therapeutic Modalities:   Cognitive Behavioral Therapy Brief Therapy  Alden Hipp, MSW, LCSW 01/10/2018 10:13 AM

## 2018-01-10 NOTE — Progress Notes (Signed)
Recreation Therapy Notes  Date: 01.22.2019  Time: 1:00 PM   Location: Craft Room  Behavioral response: Appropriate   Intervention Topic: Creative Expressions  Discussion/Intervention: Group content on today was focused on creative expressions. The group defined creative expressions and ways they use creative expressions. Individual identified other positive ways creative expressions can be used and why it is important to express yourself. Patients participated in the intervention "butterfly origami", where they had a chance to creatively express themselves. Clinical Observations/Feedback:  Patient came to group and stated creative expressions is communication. She expressed to the group that she dances to express herself. Individual participated in the intervention and continues to make progress toward her goals.  Damaria Vachon LRT/CTRS         Deissy Guilbert 01/10/2018 2:10 PM

## 2018-01-10 NOTE — [facility] Group Notes (Signed)
[place] Group Notes:  (Nursing/MHT/Case Management/Adjunct)  Date:  01/10/2018  Time:  11:15 PM  Type of Therapy:  Group Therapy  Participation Level:  Did Not Attend  Participation QualitySummary of Progress/Problems:  Katherine Stephens 01/10/2018, 11:15 PM

## 2018-01-10 NOTE — Progress Notes (Signed)
Patient is compliant, she is alert and oriented, she is slightly delayed in her responses, but she states that she does feel better, denies Si/hi or avh, she also said that she lives with her preacher and His wife in [place] and hopes to leave here soon. Patient is cooperative, has good appetite, and takes medications without any difficulty or hesitation. Nurse will continue to monitor, q 15 minute checks and also let Patient know to let staff know if she needed anything or has any concerns.

## 2018-01-10 NOTE — [facility] Group Notes (Signed)
[place] Group Notes:  (Nursing/MHT/Case Management/Adjunct)  Date:  01/10/2018  Time:  2:40 PM  Type of Therapy:  Psychoeducational Skills  Participation Level:  Minimal  Participation Quality:  Resistant  Affect:  None   Cognitive:  Alert  Insight:  Good  Engagement in Group:  Poor  Modes of Intervention:  Discussion  Summary of Progress/Problems:  Katherine Stephens 01/10/2018, 2:40 PM

## 2018-01-10 NOTE — Progress Notes (Signed)
[facility] MD Progress Note  01/10/2018 2:19 PM Katherine Stephens  MRN:  [id number]  Subjective:    Katherine Stephens is still paranoid and insists on going to class at [facility] even though she is not enrolled. She also intends to drive her car off the hospital parking lot even though it has no tags. I worry that she will be immediately arrested. Tolerates medications well. No somatic complaints.  Treatment plan. We will continue oral and injectable Invega.  Social/disposition. Homeless, uninsurred, no support,. Court date for driving w/o insurance on 03/02/2018.  Principal Problem: Undifferentiated schizophrenia ([place]) Diagnosis:   Patient Active Problem List   Diagnosis Date Noted  . Undifferentiated schizophrenia ([place]) [F20.3] 12/21/2017    Priority: High  . Noncompliance [Z91.19] 12/21/2017  . Schizophreniform disorder ([place]) [F20.81] 10/18/2017  . Auditory hallucinations [R44.0] 10/18/2017  . Poor insight into neurotic condition [F48.9] 10/18/2017  . ASCUS with positive high risk HPV cervical [R87.610, R87.810] 10/07/2017  . Hypertension [I10] 09/29/2017  . Tobacco use disorder [F17.200] 09/23/2017  . Anxiety attack [F41.0] 08/18/2017   Total Time spent with patient: 20 minutes  Past Psychiatric History: bipolar disorder  Past Medical History:  Past Medical History:  Diagnosis Date  . Hypertension   . Schizo affective schizophrenia [facility])     Past Surgical History:  Procedure Laterality Date  . NO PAST SURGERIES     Family History:  Family History  Problem Relation Age of Onset  . Hypertension Mother   . Hypertension Father   . Diabetes Maternal Grandmother    Family Psychiatric  History: bipolar disorder Social History:  Social History   Substance and Sexual Activity  Alcohol Use Yes   Comment: 2 x week     Social History   Substance and Sexual Activity  Drug Use No    Social History   Socioeconomic History  . Marital status: Divorced    Spouse name: None  .  Number of children: None  . Years of education: None  . Highest education level: None  Social Needs  . Financial resource strain: None  . Food insecurity - worry: None  . Food insecurity - inability: None  . Transportation needs - medical: None  . Transportation needs - non-medical: None  Occupational History  . None  Tobacco Use  . Smoking status: Current Some Day Smoker    Types: Cigarettes  . Smokeless tobacco: Never Used  . Tobacco comment: smokes cigarettes occasionally. Started smoking at age 56  Substance and Sexual Activity  . Alcohol use: Yes    Comment: 2 x week  . Drug use: No  . Sexual activity: Not Currently    Birth control/protection: None  Other Topics Concern  . None  Social History Narrative  . None   Additional Social History:    History of alcohol / drug use?: No history of alcohol / drug abuse                    Sleep: Fair  Appetite:  Fair  Current Medications: Current Facility-Administered Medications  Medication Dose Route Frequency Provider Last Rate Last Dose  . acetaminophen (TYLENOL) tablet 650 mg  650 mg Oral Q6H PRN Clapacs, Madie Reno, MD   650 mg at 01/09/18 1348  . alum & mag hydroxide-simeth (MAALOX/MYLANTA) 200-200-20 MG/5ML suspension 30 mL  30 mL Oral Q4H PRN Clapacs, John T, MD      . hydrOXYzine (ATARAX/VISTARIL) tablet 25 mg  25 mg Oral TID PRN Clapacs,  Madie Reno, MD      . magnesium hydroxide (MILK OF MAGNESIA) suspension 30 mL  30 mL Oral Daily PRN Clapacs, John T, MD      . paliperidone (INVEGA SUSTENNA) injection 234 mg  234 mg Intramuscular Q28 days Ikran Patman B, MD   234 mg at 01/05/18 1817  . risperiDONE (RISPERDAL M-TABS) disintegrating tablet 3 mg  3 mg Oral BID Manami Tutor B, MD   3 mg at 01/10/18 0800  . temazepam (RESTORIL) capsule 15 mg  15 mg Oral QHS PRN Zhana Jeangilles B, MD      . traZODone (DESYREL) tablet 100 mg  100 mg Oral QHS Korbin Mapps B, MD   100 mg at 01/09/18 2109    Lab  Results: No results found for this or any previous visit (from the past 48 hour(s)).  Blood Alcohol level:  Lab Results  Component Value Date   ETH <10 01/03/2018   ETH <10 46/27/0350    Metabolic Disorder Labs: Lab Results  Component Value Date   HGBA1C 5.1 01/04/2018   MPG 99.67 01/04/2018   No results found for: PROLACTIN Lab Results  Component Value Date   CHOL 178 01/04/2018   TRIG 79 01/04/2018   HDL 53 01/04/2018   CHOLHDL 3.4 01/04/2018   VLDL 16 01/04/2018   LDLCALC 109 (H) 01/04/2018    Physical Findings: AIMS: Facial and Oral Movements Muscles of Facial Expression: None, normal Lips and Perioral Area: None, normal Jaw: None, normal Tongue: None, normal,Extremity Movements Upper (arms, wrists, hands, fingers): None, normal Lower (legs, knees, ankles, toes): None, normal, Trunk Movements Neck, shoulders, hips: None, normal, Overall Severity Severity of abnormal movements (highest score from questions above): None, normal Incapacitation due to abnormal movements: None, normal Patient's awareness of abnormal movements (rate only patient's report): No Awareness, Dental Status Current problems with teeth and/or dentures?: No Does patient usually wear dentures?: No  CIWA:  CIWA-Ar Total: 2 COWS:  COWS Total Score: 2  Musculoskeletal: Strength & Muscle Tone: within normal limits Gait & Station: normal Patient leans: N/A  Psychiatric Specialty Exam: Physical Exam  Nursing note and vitals reviewed. Psychiatric: Her speech is normal. Her affect is blunt. She is withdrawn. Thought content is paranoid and delusional. Cognition and memory are normal. She expresses impulsivity.    Review of Systems  Neurological: Negative.   Psychiatric/Behavioral: Positive for hallucinations.  All other systems reviewed and are negative.   Blood pressure 127/80, pulse 96, temperature 98.3 F (36.8 C), temperature source Oral, resp. rate 18, height 5\' 7"  (1.702 m), weight 67.1  kg (148 lb), last menstrual period 12/08/2017, SpO2 100 %.Body mass index is 23.18 kg/m.  General Appearance: Disheveled  Eye Contact:  Fair  Speech:  Clear and Coherent  Volume:  Decreased  Mood:  Dysphoric  Affect:  Blunt and Congruent  Thought Process:  Goal Directed and Descriptions of Associations: Tangential  Orientation:  Full (Time, Place, and Person)  Thought Content:  Delusions, Hallucinations: Auditory and Paranoid Ideation  Suicidal Thoughts:  No  Homicidal Thoughts:  No  Memory:  Immediate;   Fair Recent;   Fair Remote;   Fair  Judgement:  Poor  Insight:  Lacking  Psychomotor Activity:  Psychomotor Retardation  Concentration:  Concentration: Fair and Attention Span: Fair  Recall:  [employer] of Knowledge:  Fair  Language:  Fair  Akathisia:  No  Handed:  Right  AIMS (if indicated):     Assets:  Communication Skills Desire for  Improvement Physical Health Resilience Social Support  ADL's:  Intact  Cognition:  WNL  Sleep:  Number of Hours: 7.25     Treatment Plan Summary: Daily contact with patient to assess and evaluate symptoms and progress in treatment and Medication management   Katherine Stephens is 32 year old female with no past psychiatric history admitted for psychotic break and deteriorating social functioning. Pt still psychotic, delusional.   #Psychosis. improving -continue Risperdal 3 mgBID -continue Invega sustenna injections, next dose o2/15/2019 -Trazodone 100 mg nightly -Restoril 15 mg nightly as needed only  #Metabolic syndrome monitoring -Lipid panel, TSH, HgbA1C are normal EKG, QTc 399 -pregnancy test is negative  #Disposition -discharge to her pastor's place -follow up with [facility]     Orson Slick, MD 01/10/2018, 2:19 PM

## 2018-01-11 NOTE — Tx Team (Signed)
Interdisciplinary Treatment and Diagnostic Plan Update  01/11/2018 Time of Session: 10:55 AM Katherine Stephens MRN: [id number]  Principal Diagnosis: Bipolar I disorder, current or most recent episode manic, with psychotic features ([place])  Secondary Diagnoses: Principal Problem:   Bipolar I disorder, current or most recent episode manic, with psychotic features ([place]) Active Problems:   Tobacco use disorder   Current Medications:  Current Facility-Administered Medications  Medication Dose Route Frequency Provider Last Rate Last Dose  . acetaminophen (TYLENOL) tablet 650 mg  650 mg Oral Q6H PRN Clapacs, Madie Reno, MD   650 mg at 01/09/18 1348  . alum & mag hydroxide-simeth (MAALOX/MYLANTA) 200-200-20 MG/5ML suspension 30 mL  30 mL Oral Q4H PRN Clapacs, John T, MD      . hydrOXYzine (ATARAX/VISTARIL) tablet 25 mg  25 mg Oral TID PRN Clapacs, John T, MD      . magnesium hydroxide (MILK OF MAGNESIA) suspension 30 mL  30 mL Oral Daily PRN Clapacs, John T, MD      . paliperidone (INVEGA SUSTENNA) injection 234 mg  234 mg Intramuscular Q28 days Pucilowska, Jolanta B, MD   234 mg at 01/05/18 1817  . risperiDONE (RISPERDAL M-TABS) disintegrating tablet 3 mg  3 mg Oral BID Pucilowska, Jolanta B, MD   3 mg at 01/11/18 7829  . temazepam (RESTORIL) capsule 15 mg  15 mg Oral QHS PRN Pucilowska, Jolanta B, MD      . traZODone (DESYREL) tablet 100 mg  100 mg Oral QHS Pucilowska, Jolanta B, MD   100 mg at 01/10/18 2130   PTA Medications: Medications Prior to Admission  Medication Sig Dispense Refill Last Dose  . risperiDONE (RISPERDAL) 1 MG tablet Take 1 tablet (1 mg total) by mouth 2 (two) times daily. (Patient not taking: Reported on 01/04/2018) 60 tablet 1 Not Taking at Unknown time    Patient Stressors: Educational concerns Financial difficulties Medication change or noncompliance  Patient Strengths: Average or above average intelligence Capable of independent living Communication  skills General fund of knowledge Religious Affiliation Supportive family/friends Work skills  Treatment Modalities: Medication Management, Group therapy, Case management,  1 to 1 session with clinician, Psychoeducation, Recreational therapy.   Physician Treatment Plan for Primary Diagnosis: Bipolar I disorder, current or most recent episode manic, with psychotic features ([place]) Long Term Goal(s): Improvement in symptoms so as ready for discharge NA   Short Term Goals: Ability to identify changes in lifestyle to reduce recurrence of condition will improve Ability to verbalize feelings will improve Ability to disclose and discuss suicidal ideas Ability to demonstrate self-control will improve Ability to identify and develop effective coping behaviors will improve Ability to maintain clinical measurements within normal limits will improve Ability to identify triggers associated with substance abuse/mental health issues will improve NA  Medication Management: Evaluate patient's response, side effects, and tolerance of medication regimen.  Therapeutic Interventions: 1 to 1 sessions, Unit Group sessions and Medication administration.  Evaluation of Outcomes: Progressing  Physician Treatment Plan for Secondary Diagnosis: Principal Problem:   Bipolar I disorder, current or most recent episode manic, with psychotic features ([place]) Active Problems:   Tobacco use disorder  Long Term Goal(s): Improvement in symptoms so as ready for discharge NA   Short Term Goals: Ability to identify changes in lifestyle to reduce recurrence of condition will improve Ability to verbalize feelings will improve Ability to disclose and discuss suicidal ideas Ability to demonstrate self-control will improve Ability to identify and develop effective coping behaviors will improve Ability to  maintain clinical measurements within normal limits will improve Ability to identify triggers associated with substance  abuse/mental health issues will improve NA     Medication Management: Evaluate patient's response, side effects, and tolerance of medication regimen.  Therapeutic Interventions: 1 to 1 sessions, Unit Group sessions and Medication administration.  Evaluation of Outcomes: Progressing   RN Treatment Plan for Primary Diagnosis: Bipolar I disorder, current or most recent episode manic, with psychotic features ([place]) Long Term Goal(s): Knowledge of disease and therapeutic regimen to maintain health will improve  Short Term Goals: Ability to demonstrate self-control, Ability to identify and develop effective coping behaviors will improve and Compliance with prescribed medications will improve  Medication Management: RN will administer medications as ordered by provider, will assess and evaluate patient's response and provide education to patient for prescribed medication. RN will report any adverse and/or side effects to prescribing provider.  Therapeutic Interventions: 1 on 1 counseling sessions, Psychoeducation, Medication administration, Evaluate responses to treatment, Monitor vital signs and CBGs as ordered, Perform/monitor CIWA, COWS, AIMS and Fall Risk screenings as ordered, Perform wound care treatments as ordered.  Evaluation of Outcomes: Progressing   LCSW Treatment Plan for Primary Diagnosis: Bipolar I disorder, current or most recent episode manic, with psychotic features ([place]) Long Term Goal(s): Safe transition to appropriate next level of care at discharge, Engage patient in therapeutic group addressing interpersonal concerns.  Short Term Goals: Engage patient in aftercare planning with referrals and resources and Increase skills for wellness and recovery  Therapeutic Interventions: Assess for all discharge needs, 1 to 1 time with Social worker, Explore available resources and support systems, Assess for adequacy in community support network, Educate family and significant other(s)  on suicide prevention, Complete Psychosocial Assessment, Interpersonal group therapy.  Evaluation of Outcomes: Progressing   Progress in Treatment: Attending groups: Yes. Participating in groups: Yes. Taking medication as prescribed: Yes. Toleration medication: Yes. Family/Significant other contact made: No, will contact:  CSW given consent to contact her pastor and his wife. Patient understands diagnosis: Yes. Discussing patient identified problems/goals with staff: Yes. Medical problems stabilized or resolved: Yes. Denies suicidal/homicidal ideation: Yes. Issues/concerns per patient self-inventory: No. Other: n/a  New problem(s) identified: No, Describe:  No new problems identified  New Short Term/Long Term Goal(s): Pt's goal is "to get out and go back to school so I can finish my Master's Degree"  Discharge Plan or Barriers: Pt has improved some, but is still sick.  The plan remains that she will discharge to the home of her pastor and his wife. She will be renting a room.  She will follow-up with [facility] for on-going tx services upon discharge Reason for Continuation of Hospitalization: Delusions  Mania Medication stabilization  Estimated Length of Stay: 3-5 days   Recreational Therapy: Patient Stressors: Friends, School Patient Goal: Patient will participate actively in groups and activities x5 days.   Attendees: Patient:  01/11/2018 3:19 PM  Physician: Orson Slick, MD 01/11/2018 3:19 PM  Nursing: Polly Cobia, RN 01/11/2018 3:19 PM  RN Care Manager: 01/11/2018 3:19 PM  Social Worker: Derrek Gu, LCSW 01/11/2018 3:19 PM  Recreational Therapist: Roanna Epley, LRT 01/11/2018 3:19 PM  Other:  01/11/2018 3:19 PM  Other:  01/11/2018 3:19 PM  Other: 01/11/2018 3:19 PM    Scribe for Treatment Team: Devona Konig, LCSW 01/11/2018 3:19 PM

## 2018-01-11 NOTE — Progress Notes (Signed)
Patient ID: Katherine Stephens, female   DOB: 1986-08-17, 33 y.o.   MRN: [id number] Isolative to room, no interaction with staff and peers, A&Ox3, denied pain, denied SI/HI, mood and affect flat, sad, depressed.

## 2018-01-11 NOTE — [facility] Group Notes (Signed)
01/11/2018 9:30AM  Type of Therapy/Topic:  Group Therapy:  Emotion Regulation  Participation Level:  Did Not Attend   Description of Group:   The purpose of this group is to assist patients in learning to regulate negative emotions and experience positive emotions. Patients will be guided to discuss ways in which they have been vulnerable to their negative emotions. These vulnerabilities will be juxtaposed with experiences of positive emotions or situations, and patients will be challenged to use positive emotions to combat negative ones. Special emphasis will be placed on coping with negative emotions in conflict situations, and patients will process healthy conflict resolution skills.  Therapeutic Goals: 1. Patient will identify two positive emotions or experiences to reflect on in order to balance out negative emotions 2. Patient will label two or more emotions that they find the most difficult to experience 3. Patient will demonstrate positive conflict resolution skills through discussion and/or role plays  Summary of Patient Progress: Patient was encouraged and invited to attend group. Patient did not attend group. Social worker will continue to encourage group participation in the future.       Therapeutic Modalities:   Cognitive Behavioral Therapy Feelings Identification Dialectical Behavioral Therapy   Darin Engels, [place] 01/11/2018 10:20 AM

## 2018-01-11 NOTE — Plan of Care (Signed)
Patient slept for Estimated Hours of 8; Precautionary checks every 15 minutes for safety maintained, room free of safety hazards, patient sustains no injury or falls during this shift.  

## 2018-01-11 NOTE — Plan of Care (Signed)
Patient denies SI,HI and AVH.

## 2018-01-11 NOTE — Progress Notes (Signed)
Patient visible in the milieu.Interacting with milieu.Denies SI,HI and AVH.Rated her depression and anxiety 0/10.Appropriate with staff & peers.Compliant with medications.Attended groups.Appetite and energy level good.Support and encouragement given.

## 2018-01-11 NOTE — Progress Notes (Signed)
[facility] MD Progress Note  01/11/2018 2:29 PM Katherine Stephens  MRN:  [id number]  Subjective:   Katherine Stephens is still paranoid and delusional, unable to participate in discharge planning. Compliant with medications and programming. Feels "tired". No other somatic complaints.  Treatment plan. Continue Invega sustenna and Risperdal.  Social/disposition. Homeless. A possibility of housing with pastor [place]. No resources.  Principal Problem: Bipolar I disorder, current or most recent episode manic, with psychotic features ([place]) Diagnosis:   Patient Active Problem List   Diagnosis Date Noted  . Bipolar I disorder, current or most recent episode manic, with psychotic features ([place]) [F31.2] 12/21/2017    Priority: High  . Noncompliance [Z91.19] 12/21/2017  . Schizophreniform disorder ([place]) [F20.81] 10/18/2017  . Auditory hallucinations [R44.0] 10/18/2017  . Poor insight into neurotic condition [F48.9] 10/18/2017  . ASCUS with positive high risk HPV cervical [R87.610, R87.810] 10/07/2017  . Hypertension [I10] 09/29/2017  . Tobacco use disorder [F17.200] 09/23/2017  . Anxiety attack [F41.0] 08/18/2017   Total Time spent with patient: 20 minutes  Past Psychiatric History: bipolar disorder  Past Medical History:  Past Medical History:  Diagnosis Date  . Hypertension   . Schizo affective schizophrenia [facility])     Past Surgical History:  Procedure Laterality Date  . NO PAST SURGERIES     Family History:  Family History  Problem Relation Age of Onset  . Hypertension Mother   . Hypertension Father   . Diabetes Maternal Grandmother    Family Psychiatric  History: bipolar disorder Social History:  Social History   Substance and Sexual Activity  Alcohol Use Yes   Comment: 2 x week     Social History   Substance and Sexual Activity  Drug Use No    Social History   Socioeconomic History  . Marital status: Divorced    Spouse name: None  . Number of children: None  . Years of  education: None  . Highest education level: None  Social Needs  . Financial resource strain: None  . Food insecurity - worry: None  . Food insecurity - inability: None  . Transportation needs - medical: None  . Transportation needs - non-medical: None  Occupational History  . None  Tobacco Use  . Smoking status: Current Some Day Smoker    Types: Cigarettes  . Smokeless tobacco: Never Used  . Tobacco comment: smokes cigarettes occasionally. Started smoking at age 89  Substance and Sexual Activity  . Alcohol use: Yes    Comment: 2 x week  . Drug use: No  . Sexual activity: Not Currently    Birth control/protection: None  Other Topics Concern  . None  Social History Narrative  . None   Additional Social History:    History of alcohol / drug use?: No history of alcohol / drug abuse                    Sleep: Fair  Appetite:  Fair  Current Medications: Current Facility-Administered Medications  Medication Dose Route Frequency Provider Last Rate Last Dose  . acetaminophen (TYLENOL) tablet 650 mg  650 mg Oral Q6H PRN Clapacs, Madie Reno, MD   650 mg at 01/09/18 1348  . alum & mag hydroxide-simeth (MAALOX/MYLANTA) 200-200-20 MG/5ML suspension 30 mL  30 mL Oral Q4H PRN Clapacs, John T, MD      . hydrOXYzine (ATARAX/VISTARIL) tablet 25 mg  25 mg Oral TID PRN Clapacs, Madie Reno, MD      . magnesium hydroxide (MILK  OF MAGNESIA) suspension 30 mL  30 mL Oral Daily PRN Clapacs, John T, MD      . paliperidone (INVEGA SUSTENNA) injection 234 mg  234 mg Intramuscular Q28 days Pucilowska, Jolanta B, MD   234 mg at 01/05/18 1817  . risperiDONE (RISPERDAL M-TABS) disintegrating tablet 3 mg  3 mg Oral BID Pucilowska, Jolanta B, MD   3 mg at 01/11/18 8366  . temazepam (RESTORIL) capsule 15 mg  15 mg Oral QHS PRN Pucilowska, Jolanta B, MD      . traZODone (DESYREL) tablet 100 mg  100 mg Oral QHS Pucilowska, Jolanta B, MD   100 mg at 01/10/18 2130    Lab Results: No results found for this or  any previous visit (from the past 48 hour(s)).  Blood Alcohol level:  Lab Results  Component Value Date   ETH <10 01/03/2018   ETH <10 29/47/6546    Metabolic Disorder Labs: Lab Results  Component Value Date   HGBA1C 5.1 01/04/2018   MPG 99.67 01/04/2018   No results found for: PROLACTIN Lab Results  Component Value Date   CHOL 178 01/04/2018   TRIG 79 01/04/2018   HDL 53 01/04/2018   CHOLHDL 3.4 01/04/2018   VLDL 16 01/04/2018   LDLCALC 109 (H) 01/04/2018    Physical Findings: AIMS: Facial and Oral Movements Muscles of Facial Expression: None, normal Lips and Perioral Area: None, normal Jaw: None, normal Tongue: None, normal,Extremity Movements Upper (arms, wrists, hands, fingers): None, normal Lower (legs, knees, ankles, toes): None, normal, Trunk Movements Neck, shoulders, hips: None, normal, Overall Severity Severity of abnormal movements (highest score from questions above): None, normal Incapacitation due to abnormal movements: None, normal Patient's awareness of abnormal movements (rate only patient's report): No Awareness, Dental Status Current problems with teeth and/or dentures?: No Does patient usually wear dentures?: No  CIWA:  CIWA-Ar Total: 2 COWS:  COWS Total Score: 2  Musculoskeletal: Strength & Muscle Tone: within normal limits Gait & Station: normal Patient leans: N/A  Psychiatric Specialty Exam: Physical Exam  Nursing note and vitals reviewed. Psychiatric: Her speech is normal. Judgment normal. Her mood appears anxious. Her affect is blunt. She is withdrawn. Thought content is paranoid and delusional. Cognition and memory are normal.    Review of Systems  Neurological: Negative.   Psychiatric/Behavioral: Positive for hallucinations.  All other systems reviewed and are negative.   Blood pressure 115/67, pulse 87, temperature 98.1 F (36.7 C), temperature source Oral, resp. rate 18, height 5\' 7"  (1.702 m), weight 67.1 kg (148 lb), last  menstrual period 12/08/2017, SpO2 100 %.Body mass index is 23.18 kg/m.  General Appearance: Fairly Groomed  Eye Contact:  Poor  Speech:  Clear and Coherent  Volume:  Normal  Mood:  Dysphoric  Affect:  Congruent  Thought Process:  Goal Directed and Descriptions of Associations: Intact  Orientation:  Full (Time, Place, and Person)  Thought Content:  Delusions and Paranoid Ideation  Suicidal Thoughts:  No  Homicidal Thoughts:  No  Memory:  Immediate;   Fair Recent;   Fair Remote;   Fair  Judgement:  Poor  Insight:  Lacking  Psychomotor Activity:  Decreased  Concentration:  Concentration: Fair and Attention Span: Fair  Recall:  [employer] of Knowledge:  Fair  Language:  Fair  Akathisia:  No  Handed:  Right  AIMS (if indicated):     Assets:  Communication Skills Desire for Improvement Financial Resources/Insurance Housing Physical Health Resilience Social Support  ADL's:  Intact  Cognition:  WNL  Sleep:  Number of Hours: 8     Treatment Plan Summary: Daily contact with patient to assess and evaluate symptoms and progress in treatment and Medication management   Katherine Stephens is 32 year old female with no past psychiatric history admitted for psychotic break and deteriorating social functioning. Pt still psychotic, delusional.   #Psychosis, improving -continue Risperdal 3 mgBID -continue Invega sustenna injections, next doseon 02/03/2018 -Trazodone 100 mg nightly -Restoril 15 mg nightly as needed only  #Metabolic syndrome monitoring -Lipid panel, TSH, HgbA1C are normal EKG, QTc 399 -pregnancy test is negative  #Disposition -discharge to her pastor's place -follow up with [facility]    Orson Slick, MD 01/11/2018, 2:29 PM

## 2018-01-11 NOTE — Progress Notes (Signed)
Recreation Therapy Notes  Date: 01.23 .2018  Time: 3:00pm  Location: Craft room  Behavioral response: Appropriate  Group Type: Craft  Participation level: Active  Communication: Patient was social with peers and staff.  Comments: N/A  Chrsitopher Wik LRT/CTRS        Jester Klingberg 01/11/2018 4:10 PM

## 2018-01-11 NOTE — Progress Notes (Signed)
Recreation Therapy Notes  Date: 01.23.2019  Time: 1:00 PM  Location: Craft Room  Behavioral response: Appropriate   Intervention Topic: Team work  Discussion/Intervention: Group content on today was focused on teamwork. The group identified what teamwork is. Individuals described who is a part of their team. Patients expressed why they thought teamwork is important. The group stated reasons why they thought it was easier to work with a Dance movement psychotherapist team. Individuals discussed some positives and negatives of working with a team. Patients gave examples of past experiences they had while working with a team. The group participated in the intervention "Save the Virgil", patients were in groups and had to keep the balls on the surface given.  Clinical Observations/Feedback:  Patient came to group and stated teamwork is people who have the same mindset. She expressed that she looks for her team members to be faithful. Individual identified her church family is apart of her family. Patient stated a negative aspect of working as a team is that everyone will not pull their weight. She participated in the intervention and continues to make progress towards her goals.    Sruti Ayllon LRT/CTRS         Earlie Arciga 01/11/2018 1:58 PM

## 2018-01-12 NOTE — Progress Notes (Signed)
[facility] MD Progress Note  01/12/2018 3:03 PM Katherine Stephens  MRN:  [id number]  Subjective:    Katherine Stephens is improving slowly but still paranoid and unreasonable. She denies AH but has not been engaging. Reports no side effects of somatic complaints.  Spoke with Katherine Stephens, her friend from [place], who is supportive and helps to organized housing following discharge.   Social/disposition. TBE.  Principal Problem: Bipolar I disorder, current or most recent episode manic, with psychotic features ([place]) Diagnosis:   Patient Active Problem List   Diagnosis Date Noted  . Bipolar I disorder, current or most recent episode manic, with psychotic features ([place]) [F31.2] 12/21/2017    Priority: High  . Noncompliance [Z91.19] 12/21/2017  . Schizophreniform disorder ([place]) [F20.81] 10/18/2017  . Auditory hallucinations [R44.0] 10/18/2017  . Poor insight into neurotic condition [F48.9] 10/18/2017  . ASCUS with positive high risk HPV cervical [R87.610, R87.810] 10/07/2017  . Hypertension [I10] 09/29/2017  . Tobacco use disorder [F17.200] 09/23/2017  . Anxiety attack [F41.0] 08/18/2017   Total Time spent with patient: 20 minutes  Past Psychiatric History: bipolar disorder  Past Medical History:  Past Medical History:  Diagnosis Date  . Hypertension   . Schizo affective schizophrenia [facility])     Past Surgical History:  Procedure Laterality Date  . NO PAST SURGERIES     Family History:  Family History  Problem Relation Age of Onset  . Hypertension Mother   . Hypertension Father   . Diabetes Maternal Grandmother    Family Psychiatric  History: bipolar disorder Social History:  Social History   Substance and Sexual Activity  Alcohol Use Yes   Comment: 2 x week     Social History   Substance and Sexual Activity  Drug Use No    Social History   Socioeconomic History  . Marital status: Divorced    Spouse name: None  . Number of children: None  . Years of education: None   . Highest education level: None  Social Needs  . Financial resource strain: None  . Food insecurity - worry: None  . Food insecurity - inability: None  . Transportation needs - medical: None  . Transportation needs - non-medical: None  Occupational History  . None  Tobacco Use  . Smoking status: Current Some Day Smoker    Types: Cigarettes  . Smokeless tobacco: Never Used  . Tobacco comment: smokes cigarettes occasionally. Started smoking at age 39  Substance and Sexual Activity  . Alcohol use: Yes    Comment: 2 x week  . Drug use: No  . Sexual activity: Not Currently    Birth control/protection: None  Other Topics Concern  . None  Social History Narrative  . None   Additional Social History:    History of alcohol / drug use?: No history of alcohol / drug abuse                    Sleep: Fair  Appetite:  Fair  Current Medications: Current Facility-Administered Medications  Medication Dose Route Frequency Provider Last Rate Last Dose  . acetaminophen (TYLENOL) tablet 650 mg  650 mg Oral Q6H PRN Clapacs, Madie Reno, MD   650 mg at 01/09/18 1348  . alum & mag hydroxide-simeth (MAALOX/MYLANTA) 200-200-20 MG/5ML suspension 30 mL  30 mL Oral Q4H PRN Clapacs, John T, MD      . hydrOXYzine (ATARAX/VISTARIL) tablet 25 mg  25 mg Oral TID PRN Clapacs, Madie Reno, MD      .  magnesium hydroxide (MILK OF MAGNESIA) suspension 30 mL  30 mL Oral Daily PRN Clapacs, John T, MD      . paliperidone (INVEGA SUSTENNA) injection 234 mg  234 mg Intramuscular Q28 days Felisa Zechman B, MD   234 mg at 01/05/18 1817  . risperiDONE (RISPERDAL M-TABS) disintegrating tablet 3 mg  3 mg Oral BID Azzie Thiem B, MD   3 mg at 01/12/18 0823  . temazepam (RESTORIL) capsule 15 mg  15 mg Oral QHS PRN Brand Siever B, MD      . traZODone (DESYREL) tablet 100 mg  100 mg Oral QHS Kambryn Dapolito B, MD   100 mg at 01/11/18 2112    Lab Results: No results found for this or any previous  visit (from the past 48 hour(s)).  Blood Alcohol level:  Lab Results  Component Value Date   ETH <10 01/03/2018   ETH <10 12/22/7251    Metabolic Disorder Labs: Lab Results  Component Value Date   HGBA1C 5.1 01/04/2018   MPG 99.67 01/04/2018   No results found for: PROLACTIN Lab Results  Component Value Date   CHOL 178 01/04/2018   TRIG 79 01/04/2018   HDL 53 01/04/2018   CHOLHDL 3.4 01/04/2018   VLDL 16 01/04/2018   LDLCALC 109 (H) 01/04/2018    Physical Findings: AIMS: Facial and Oral Movements Muscles of Facial Expression: None, normal Lips and Perioral Area: None, normal Jaw: None, normal Tongue: None, normal,Extremity Movements Upper (arms, wrists, hands, fingers): None, normal Lower (legs, knees, ankles, toes): None, normal, Trunk Movements Neck, shoulders, hips: None, normal, Overall Severity Severity of abnormal movements (highest score from questions above): None, normal Incapacitation due to abnormal movements: None, normal Patient's awareness of abnormal movements (rate only patient's report): No Awareness, Dental Status Current problems with teeth and/or dentures?: No Does patient usually wear dentures?: No  CIWA:  CIWA-Ar Total: 2 COWS:  COWS Total Score: 2  Musculoskeletal: Strength & Muscle Tone: within normal limits Gait & Station: normal Patient leans: N/A  Psychiatric Specialty Exam: Physical Exam  Nursing note and vitals reviewed. Psychiatric: Her speech is normal. Her affect is blunt. She is withdrawn. Thought content is paranoid and delusional. Cognition and memory are normal. She expresses impulsivity.    Review of Systems  Neurological: Negative.   Psychiatric/Behavioral: Positive for hallucinations.  All other systems reviewed and are negative.   Blood pressure 123/69, pulse 83, temperature 98.1 F (36.7 C), temperature source Oral, resp. rate 18, height 5\' 7"  (1.702 m), weight 67.1 kg (148 lb), last menstrual period 12/08/2017, SpO2  100 %.Body mass index is 23.18 kg/m.  General Appearance: Casual  Eye Contact:  Good  Speech:  Clear and Coherent  Volume:  Normal  Mood:  Dysphoric  Affect:  Flat  Thought Process:  Goal Directed and Descriptions of Associations: Intact  Orientation:  Full (Time, Place, and Person)  Thought Content:  Delusions and Paranoid Ideation  Suicidal Thoughts:  No  Homicidal Thoughts:  No  Memory:  Immediate;   Fair Recent;   Fair Remote;   Fair  Judgement:  Poor  Insight:  Lacking  Psychomotor Activity:  Psychomotor Retardation  Concentration:  Concentration: Fair and Attention Span: Fair  Recall:  [employer] of Knowledge:  Fair  Language:  Fair  Akathisia:  No  Handed:  Right  AIMS (if indicated):     Assets:  Communication Skills Desire for Improvement Physical Health Resilience Social Support  ADL's:  Intact  Cognition:  WNL  Sleep:  Number of Hours: 6.15     Treatment Plan Summary: Daily contact with patient to assess and evaluate symptoms and progress in treatment and Medication management   Ms. Pabst is 32 year old female with no past psychiatric history admitted for psychotic break and deteriorating social functioning. Pt still psychotic, delusional.   #Psychosis, improving -continue Risperdal 3 mgBID -continue Invega sustenna injections, next doseon 02/03/2018 -Trazodone 100 mg nightly -Restoril 15 mg nightly as needed only  #Metabolic syndrome monitoring -Lipid panel, TSH, HgbA1C are normal EKG, QTc 399 -pregnancy test is negative  #Disposition -discharge to her pastor's place -follow up with [facility]     Orson Slick, MD 01/12/2018, 3:03 PM

## 2018-01-12 NOTE — [facility] Group Notes (Signed)
  LCSW Group Therapy Note 01/12/2018 9:00 AM  Type of Therapy and Topic:  Group Therapy:  Setting Goals  Participation Level:  Minimal  Description of Group: In this process group, patients discussed using strengths to work toward goals and address challenges.  Patients identified two positive things about themselves and one goal they were working on.  Patients were given the opportunity to share openly and support each other's plan for self-empowerment.  The group discussed the value of gratitude and were encouraged to have a daily reflection of positive characteristics or circumstances.  Patients were encouraged to identify a plan to utilize their strengths to work on current challenges and goals.  Therapeutic Goals 1. Patient will verbalize personal strengths/positive qualities and relate how these can assist with achieving desired personal goals 2. Patients will verbalize affirmation of peers plans for personal change and goal setting 3. Patients will explore the value of gratitude and positive focus as related to successful achievement of goals 4. Patients will verbalize a plan for regular reinforcement of personal positive qualities and circumstances.  Summary of Patient Progress: [name] participated minimally in group today.  She stated that she had somewhat of an understanding of how she can develop her own SMART goals.  [name] was able to share that her goal for today is "to learn more about how I can establish more specific goals for myself".      Therapeutic Modalities Cognitive Behavioral Therapy Motivational Interviewing    Devona Konig, [place] 01/12/2018 11:43 AM

## 2018-01-12 NOTE — Progress Notes (Signed)
Patient was in bed at the beginning of this shift. Upon assessment, patient alert and oriented and denying thoughts of self harm. Patient is flat and avoiding long conversations. Patient not motivated for evening activities, requesting to be given time to rest. Walked to the medication room and had her bedtime medication. Patient returned to her room and currently sleeping. No sign of distress noted. Emotional support and encouragements provided. Safety precautions maintained per Q 15 minute  level of observation.

## 2018-01-12 NOTE — Plan of Care (Signed)
Patient slept for Estimated Hours of 6.15; Precautionary checks every 15 minutes for safety maintained, room free of safety hazards, patient sustains no injury or falls during this shift.  

## 2018-01-12 NOTE — Progress Notes (Signed)
Recreation Therapy Notes  Date: 01.24.2019  Time: 1:00 PM  Location: Craft Room  Behavioral response: Appropriate   Intervention Topic: Problem Solving  Discussion/Intervention: Group content on today was focused on problem solving. The group described what problem solving is. Patients expressed how problems affect them and how they deal with problems. Individuals identified healthy ways to deal with problems. Patients explained what normally happens to them when they do not deal with problems. The group expressed reoccurring problems for them. The group participated in the intervention "Ways to Solve problems" where patients were given a chance to explore different ways to solve problems.  Clinical Observations/Feedback:  Patient came to group and stated problem solving is solving conflict. She stated a way to deal with problems is seek out solutions. Individual expressed that she asks herself the five questions; Who, What, When, Where and Why to solve her problems. Individual participated in the intervention and was social with peers and staff during group.   Yakir Wenke LRT/CTRS          Jlyn Cerros 01/12/2018 2:01 PM

## 2018-01-12 NOTE — [facility] Group Notes (Signed)
01/12/2018  Time: 0930  Type of Therapy/Topic:  Group Therapy:  Balance in Life  Participation Level:  Minimal  Description of Group:   This group will address the concept of balance and how it feels and looks when one is unbalanced. Patients will be encouraged to process areas in their lives that are out of balance and identify reasons for remaining unbalanced. Facilitators will guide patients in utilizing problem-solving interventions to address and correct the stressor making their life unbalanced. Understanding and applying boundaries will be explored and addressed for obtaining and maintaining a balanced life. Patients will be encouraged to explore ways to assertively make their unbalanced needs known to significant others in their lives, using other group members and facilitator for support and feedback.  Therapeutic Goals: 1. Patient will identify two or more emotions or situations they have that consume much of in their lives. 2. Patient will identify signs/triggers that life has become out of balance:  3. Patient will identify two ways to set boundaries in order to achieve balance in their lives:  4. Patient will demonstrate ability to communicate their needs through discussion and/or role plays  Summary of Patient Progress: Pt continues to work towards their tx goals but has not yet reached them. Pt was able to appropriately participate in group discussion, and was able to offer support/validation to other group members. Pt reported she is feeling lethargic today, "I guess just because I'm in a hospital." Pt reported feeling like she spends too much time on her spirituality, but thinks that it is good for her. Pt reported one way to set better boundaries is to be more assertive.   Therapeutic Modalities:   Cognitive Behavioral Therapy Solution-Focused Therapy Assertiveness Training  Alden Hipp, MSW, LCSW 01/12/2018 10:38 AM

## 2018-01-12 NOTE — Progress Notes (Signed)
Patient ID: Katherine Stephens, female   DOB: 1986-11-03, 32 y.o.   MRN: [id number] Improved affect and mood but not at baseline yet, no isolation from others, visible in the milieu, attentive and participated in the evening group; complied with medication, initiated a dialogue, logical and reactive; denied SI/HI/SIB/AVH.

## 2018-01-12 NOTE — Progress Notes (Signed)
D- Patient alert and oriented. Patient presents in a pleasant mood on assessment stating that she slept ok, but is "just a little tired" today. Patient denies SI, HI, AVH, and pain at this time. Patient also denies depression and anxiety at this time. Patient's goal for today is "to work on getting discharged", which she will work on by "pay attention & work with staff".  A- Scheduled medications administered to patient, per MD orders. Support and encouragement provided.  Routine safety checks conducted every 15 minutes.  Patient informed to notify staff with problems or concerns.  R- No adverse drug reactions noted. Patient contracts for safety at this time. Patient compliant with medications and treatment plan. Patient receptive, calm, and cooperative. Patient interacts well with others on the unit.  Patient remains safe at this time.

## 2018-01-12 NOTE — Plan of Care (Signed)
Patient was isolative in room but was able to go to the medication room for bedtime medications.

## 2018-01-12 NOTE — Plan of Care (Signed)
Patient has shown interest in unit activities by participating in unit groups as well as interacting with other members on the unit in the day room without any issues thus far. Patient states that she slept good last night but reports being "just a little tired" this morning. Patient denies any signs/symptoms of depression/anxiety as well as SI/HI/AVH at this time. Patient has the ability to make decisions and verbalizes understanding of her therapeutic/medication regimen and has been in compliance thus far. Patient has been free from injury thus far and is safe on the unit at this time.

## 2018-01-13 MED ORDER — PALIPERIDONE PALMITATE 234 MG/1.5ML IM SUSP: 234.0000 mg | INTRAMUSCULAR | 1 refills | Status: AC

## 2018-01-13 MED ORDER — TRAZODONE HCL 100 MG PO TABS: 100.0000 mg | ORAL_TABLET | Freq: Every day | ORAL | 1 refills | Status: DC

## 2018-01-13 MED ORDER — RISPERIDONE 3 MG PO TBDP: 3.0000 mg | ORAL_TABLET | Freq: Two times a day (BID) | ORAL | 1 refills | Status: DC

## 2018-01-13 NOTE — BHH Suicide Risk Assessment (Signed)
[facility] Discharge Suicide Risk Assessment   Principal Problem: Bipolar I disorder, current or most recent episode manic, with psychotic features [facility]) Discharge Diagnoses:  Patient Active Problem List   Diagnosis Date Noted  . Bipolar I disorder, current or most recent episode manic, with psychotic features ([place]) [F31.2] 12/21/2017    Priority: High  . Noncompliance [Z91.19] 12/21/2017  . Schizophreniform disorder ([place]) [F20.81] 10/18/2017  . Auditory hallucinations [R44.0] 10/18/2017  . Poor insight into neurotic condition [F48.9] 10/18/2017  . ASCUS with positive high risk HPV cervical [R87.610, R87.810] 10/07/2017  . Hypertension [I10] 09/29/2017  . Tobacco use disorder [F17.200] 09/23/2017  . Anxiety attack [F41.0] 08/18/2017    Total Time spent with patient: 30 minutes  Musculoskeletal: Strength & Muscle Tone: within normal limits Gait & Station: normal Patient leans: N/A  Psychiatric Specialty Exam: Review of Systems  Neurological: Negative.   Psychiatric/Behavioral: Negative.   All other systems reviewed and are negative.   Blood pressure 118/67, pulse 89, temperature 98.4 F (36.9 C), temperature source Oral, resp. rate 18, height 5\' 7"  (1.702 m), weight 67.1 kg (148 lb), last menstrual period 12/08/2017, SpO2 100 %.Body mass index is 23.18 kg/m.  General Appearance: Casual  Eye Contact::  Good  Speech:  Clear and Coherent409  Volume:  Normal  Mood:  Euthymic  Affect:  Appropriate  Thought Process:  Goal Directed and Descriptions of Associations: Intact  Orientation:  Full (Time, Place, and Person)  Thought Content:  WDL  Suicidal Thoughts:  No  Homicidal Thoughts:  No  Memory:  Immediate;   Fair Recent;   Fair Remote;   Fair  Judgement:  Impaired  Insight:  Shallow  Psychomotor Activity:  Normal  Concentration:  Fair  Recall:  [place]  Language: Fair  Akathisia:  No  Handed:  Right  AIMS (if indicated):     Assets:  Communication  Skills Desire for Improvement Housing Physical Health Resilience Social Support Transportation Vocational/Educational  Sleep:  Number of Hours: 7  Cognition: WNL  ADL's:  Intact   Mental Status Per Nursing Assessment::   On Admission:  NA  Demographic Factors:  Low socioeconomic status and Unemployed  Loss Factors: Legal issues and Financial problems/change in socioeconomic status  Historical Factors: Family history of mental illness or substance abuse and Impulsivity  Risk Reduction Factors:   Sense of responsibility to family, Religious beliefs about death, Living with another person, especially a relative and Positive social support  Continued Clinical Symptoms:  Bipolar Disorder:   Mixed State  Cognitive Features That Contribute To Risk:  None    Suicide Risk:  Minimal: No identifiable suicidal ideation.  Patients presenting with no risk factors but with morbid ruminations; may be classified as minimal risk based on the severity of the depressive symptoms    Plan Of Care/Follow-up recommendations:  Activity:  as tolerated Diet:  low sodium heart healthy Other:  keep follow up appointments  Orson Slick, MD 01/13/2018, 3:02 PM

## 2018-01-13 NOTE — BHH Group Notes (Signed)
[place] Group Notes:  (Nursing/MHT/Case Management/Adjunct)  Date:  01/13/2018  Time:  6:57 AM  Type of Therapy:  Psychoeducational Skills  Participation Level:  Active  Participation Quality:  Appropriate and Attentive  Affect:  Appropriate  Cognitive:  Appropriate  Insight:  Appropriate and Good  Engagement in Group:  Engaged  Modes of Intervention:  Discussion, Socialization and Support  Summary of Progress/Problems:  Reece Agar 01/13/2018, 6:57 AM

## 2018-01-13 NOTE — BHH Group Notes (Signed)
01/13/2018 9:30AM  Type of Therapy and Topic:  Group Therapy:  Feelings around Relapse and Recovery  Participation Level:  None   Description of Group:    Patients in this group will discuss emotions they experience before and after a relapse. They will process how experiencing these feelings, or avoidance of experiencing them, relates to having a relapse. Facilitator will guide patients to explore emotions they have related to recovery. Patients will be encouraged to process which emotions are more powerful. They will be guided to discuss the emotional reaction significant others in their lives may have to patients' relapse or recovery. Patients will be assisted in exploring ways to respond to the emotions of others without this contributing to a relapse.  Therapeutic Goals: 1. Patient will identify two or more emotions that lead to a relapse for them 2. Patient will identify two emotions that result when they relapse 3. Patient will identify two emotions related to recovery 4. Patient will demonstrate ability to communicate their needs through discussion and/or role plays   Summary of Patient Progress: Katherine Stephens attended the group and stayed the entire time. She did not participate but did listen to others comments and statements regarding the topic.    Therapeutic Modalities:   Cognitive Behavioral Therapy Solution-Focused Therapy Assertiveness Training Relapse Prevention Therapy   Darin Engels, [place] 01/13/2018 10:27 AM

## 2018-01-13 NOTE — Progress Notes (Signed)
  [facility] Adult Case Management Discharge Plan :  Will you be returning to the same living situation after discharge:  Yes,  Pt will be living with a family friend. At discharge, do you have transportation home?: Yes,  A family friend will be picking her up at discharge Do you have the ability to pay for your medications: No.  Release of information consent forms completed and in the chart;  Patient's signature needed at discharge.  Patient to Follow up at:   Next level of care provider has access to [place] and Suicide Prevention discussed: Yes,  No safety issues noted  Have you used any form of tobacco in the last 30 days? (Cigarettes, Smokeless Tobacco, Cigars, and/or Pipes): Yes  Has patient been referred to the Quitline?: N/A patient is not a smoker  Patient has been referred for addiction treatment: [place], LCSW 01/13/2018, 3:16 PM

## 2018-01-13 NOTE — Progress Notes (Signed)
   01/13/18 1500  Clinical Encounter Type  Visited With Patient  Visit Type Other (Comment) (group)  Spiritual Encounters  Spiritual Needs Emotional   Chaplain facilitated group in which patient participated.  Group involved communication skills and relaxation/visualization techniques.

## 2018-01-13 NOTE — Progress Notes (Signed)
Recreation Therapy Notes  Date: 01.25.2019  Time: 1:00 PM  Location: Craft Room  Behavioral response: Appropriate   Intervention Topic: Time Management  Discussion/Intervention: Group content today was focused on time management. The group defined time management and identified healthy ways to manage time. Individuals expressed how much of the 24 hours they use in a day. Patients expressed how much time they use just for themselves personally. The group expressed how they have managed their time in the past. Individuals participated in the intervention "Managing Life" where they had a chance to see how much of the 24 hours they use and where it goes. Clinical Observations/Feedback:  Patient came to group and defined time management as organizing your schedule. She stated she uses 12-13 hours out of 24 hours and 3 hours are personally for her. Individual stated we manage time to keep our goals on tract. Patient participated in the intervention and continues to make progress towards her goals.  Hewitt Garner LRT/CTRS         Katherine Stephens 01/13/2018 2:37 PM

## 2018-01-13 NOTE — BHH Suicide Risk Assessment (Signed)
[place] INPATIENT:  Family/Significant Other Suicide Prevention Education  Suicide Prevention Education:  Contact Attempts: Lavella Lemons at [phone] (family friend/pastor) has been identified by the patient as the family member/significant other with whom the patient will be residing, and identified as the person(s) who will aid the patient in the event of a mental health crisis.  With written consent from the patient, two attempts were made to provide suicide prevention education, prior to and/or following the patient's discharge.  We were unsuccessful in providing suicide prevention education.  A suicide education pamphlet was given to the patient to share with family/significant other.  Date and time of first attempt:01/05/18/2:32 PM Date and time of second attempt: 01/11/18/5:15 PM  Devona Konig, LCSW 01/13/2018, 5:59 PM

## 2018-01-13 NOTE — Progress Notes (Signed)
Patient ID: Katherine Stephens, female   DOB: [birth year], 32 y.o.   MRN: [id number] CSW made contact with pt's father Madeleine Fenn.  CSW informed Mr. Blankenbeckler that pt would most likely be discharged today and asked if he had been able to get in touch with Mr. Haynes Dage, who owns the apartment that pt will be discharging to.  Mr. Wyman informed CSW that he has to use messenger to communicate with Mr. Haynes Dage and that he had sent a message to him on 01/12/18 informing him of pt's discharge today.  Mr. Muhlestein shared that he received a confirmation that Mr. Haynes Dage had read the messenger.  Mr. Blizard informed CSW that he will be paying pt's rent initially until she is able to secure employment. CSW contacted [facility] and make a follow-up apt for pt.  CSW assisted pt with completing the Medication Management Assistance application. Milda Smart, pt's godfather will be picking pt up at 4:30P for discharge.

## 2018-01-13 NOTE — BHH Group Notes (Signed)
[place] Group Notes:  (Nursing/MHT/Case Management/Adjunct)  Date:  01/13/2018  Time:  2:54 PM  Type of Therapy:  Psychoeducational Skills  Participation Level:  Active  Participation Quality:  Appropriate  Affect:  Appropriate  Cognitive:  Appropriate  Insight:  Good  Engagement in Group:  Engaged  Modes of Intervention:  Activity  Summary of Progress/Problems:  Lenor Coffin 01/13/2018, 2:54 PM

## 2018-01-13 NOTE — Progress Notes (Signed)
Patient ID: Katherine Stephens, female   DOB: 15-Sep-1986, 32 y.o.   MRN: [id number]   Discharge Note:  Patient denies SI/HI/AVH at this time. Discharge instructions, AVS, transition record, and prescriptions gone over with patient. Patient belongings returned to patient. Patient agrees to comply with medication management, follow-up visit, and outpatient therapy. Patient questions and concerns addressed and answered. Patient ambulatory off unit. Patient discharged to home with God-father.

## 2018-01-13 NOTE — Discharge Summary (Signed)
Physician Discharge Summary Note  Patient:  Katherine Stephens is an 32 y.o., female MRN:  [id number] DOB:  1985-12-23 Patient phone:  [phone] (home)  Patient address:   [place] [place] [address],  Total Time spent with patient: 30 minutes  Date of Admission:  01/04/2018 Date of Discharge: 01/13/2018  Reason for Admission:  Psychotic break  History of Present Illness:   Identifying data. [name] is a 31 year old female with no past psychiatric history.  Chief complaint. "My friend told me to come."  History of present illness. Information was onbtained from the patirnt and the chart. The patient came to the ER at the urging of her friends who found her disturbingly dysfunctional, paranoid and hallucinating. The patient denies any symptoms of depression, anxiety or psychosis. She does not believe that anything is wrong with her but gives me a very convoluted story full of paranoia. She paid $6,000 last summer to take 2 sauumer classes at [employer] with a plan to continue undergraduate degree at [employer]. Because of FBI, hacking, and prejudice, she has not been able to accomplish her goal, was evicted and lost her registration plates. She still intends to go to [employer] apply for school and [employer] and start school on Friday. Denies suicidal ideation or substance abuse.   Past psychiatric history. Never hospitalized,. No suicide attempts. Briefly treated with Paxil and Lexapro 10 years ago and in [facility] counseling.  Family psychiatric history. Cousin committed suicide.  Social history. She grew up in [place] and went to college some. She is "pastor's daughter". Her father lives in [place]. She claims to speak to him daily. Still has [place] cell phone number. She is still married but her husband is stationed at [employer]. She was in the [employer] for 8 months. Apparently too short for benefits. Has a son who lives in [place] with grandparents. Was evicted from her  apartment for not paying. She is almost homeless and unemployed. She is allowed to stay with her pastor for a while. Owns a car but it is not insured and police took license plates.  Tried to call her friend Roselyn Reef [phone] but no response. Her father in [place] did not respond either.  Principal Problem: Bipolar I disorder, current or most recent episode manic, with psychotic features [facility]) Discharge Diagnoses: Patient Active Problem List   Diagnosis Date Noted  . Bipolar I disorder, current or most recent episode manic, with psychotic features ([place]) [F31.2] 12/21/2017    Priority: High  . Noncompliance [Z91.19] 12/21/2017  . Schizophreniform disorder ([place]) [F20.81] 10/18/2017  . Auditory hallucinations [R44.0] 10/18/2017  . Poor insight into neurotic condition [F48.9] 10/18/2017  . ASCUS with positive high risk HPV cervical [R87.610, R87.810] 10/07/2017  . Hypertension [I10] 09/29/2017  . Tobacco use disorder [F17.200] 09/23/2017  . Anxiety attack [F41.0] 08/18/2017     Past Medical History:  Past Medical History:  Diagnosis Date  . Hypertension   . Schizo affective schizophrenia [facility])     Past Surgical History:  Procedure Laterality Date  . NO PAST SURGERIES     Family History:  Family History  Problem Relation Age of Onset  . Hypertension Mother   . Hypertension Father   . Diabetes Maternal Grandmother    Social History:  Social History   Substance and Sexual Activity  Alcohol Use Yes   Comment: 2 x week     Social History   Substance and Sexual Activity  Drug Use No  Social History   Socioeconomic History  . Marital status: Divorced    Spouse name: None  . Number of children: None  . Years of education: None  . Highest education level: None  Social Needs  . Financial resource strain: None  . Food insecurity - worry: None  . Food insecurity - inability: None  . Transportation needs - medical: None  . Transportation needs -  non-medical: None  Occupational History  . None  Tobacco Use  . Smoking status: Current Some Day Smoker    Types: Cigarettes  . Smokeless tobacco: Never Used  . Tobacco comment: smokes cigarettes occasionally. Started smoking at age 55  Substance and Sexual Activity  . Alcohol use: Yes    Comment: 2 x week  . Drug use: No  . Sexual activity: Not Currently    Birth control/protection: None  Other Topics Concern  . None  Social History Narrative  . None    Hospital Course:    Ms. Dial is 32 year old female with no past psychiatric history admitted for psychotic break and deteriorating social functioning. The patient accepted Invega including invega sustenna and all her symptoms resolved. Her father from [place] and local friends provided financial and emotional support.  #Psychosis,improved -continue Invega sustenna injections, next NATFTD3/22/0254  #Metabolic syndrome monitoring -Lipid panel, TSH, HgbA1C are normal EKG, QTc 399 -pregnancy test is negative  #Disposition -discharge to her pastor's place -follow up with [facility]  Physical Findings: AIMS: Facial and Oral Movements Muscles of Facial Expression: None, normal Lips and Perioral Area: None, normal Jaw: None, normal Tongue: None, normal,Extremity Movements Upper (arms, wrists, hands, fingers): None, normal Lower (legs, knees, ankles, toes): None, normal, Trunk Movements Neck, shoulders, hips: None, normal, Overall Severity Severity of abnormal movements (highest score from questions above): None, normal Incapacitation due to abnormal movements: None, normal Patient's awareness of abnormal movements (rate only patient's report): No Awareness, Dental Status Current problems with teeth and/or dentures?: No Does patient usually wear dentures?: No  CIWA:  CIWA-Ar Total: 2 COWS:  COWS Total Score: 2  Musculoskeletal: Strength & Muscle Tone: within normal limits Gait & Station: normal Patient leans:  N/A  Psychiatric Specialty Exam: Physical Exam  Nursing note and vitals reviewed. Psychiatric: She has a normal mood and affect. Her speech is normal and behavior is normal. Judgment and thought content normal. Cognition and memory are normal.    Review of Systems  Neurological: Negative.   Psychiatric/Behavioral: Negative.   All other systems reviewed and are negative.   Blood pressure 118/67, pulse 89, temperature 98.4 F (36.9 C), temperature source Oral, resp. rate 18, height 5\' 7"  (1.702 m), weight 67.1 kg (148 lb), last menstrual period 12/08/2017, SpO2 100 %.Body mass index is 23.18 kg/m.  General Appearance: Casual  Eye Contact:  Good  Speech:  Clear and Coherent  Volume:  Normal  Mood:  Euthymic  Affect:  Appropriate  Thought Process:  Goal Directed and Descriptions of Associations: Intact  Orientation:  Full (Time, Place, and Person)  Thought Content:  WDL  Suicidal Thoughts:  No  Homicidal Thoughts:  No  Memory:  Immediate;   Fair Recent;   Fair Remote;   Fair  Judgement:  Impaired  Insight:  Shallow  Psychomotor Activity:  Normal  Concentration:  Concentration: Fair and Attention Span: Fair  Recall:  [employer] of Knowledge:  Fair  Language:  Fair  Akathisia:  No  Handed:  Right  AIMS (if indicated):     Assets:  Communication Skills Desire for Improvement Financial Resources/Insurance Housing Physical Health Resilience Social Support Transportation Vocational/Educational  ADL's:  Intact  Cognition:  WNL  Sleep:  Number of Hours: 7     Have you used any form of tobacco in the last 30 days? (Cigarettes, Smokeless Tobacco, Cigars, and/or Pipes): Yes  Has this patient used any form of tobacco in the last 30 days? (Cigarettes, Smokeless Tobacco, Cigars, and/or Pipes) Yes, No  Blood Alcohol level:  Lab Results  Component Value Date   ETH <10 01/03/2018   ETH <10 40/09/2724    Metabolic Disorder Labs:  Lab Results  Component Value Date   HGBA1C  5.1 01/04/2018   MPG 99.67 01/04/2018   No results found for: PROLACTIN Lab Results  Component Value Date   CHOL 178 01/04/2018   TRIG 79 01/04/2018   HDL 53 01/04/2018   CHOLHDL 3.4 01/04/2018   VLDL 16 01/04/2018   LDLCALC 109 (H) 01/04/2018    See Psychiatric Specialty Exam and Suicide Risk Assessment completed by Attending Physician prior to discharge.  Discharge destination:  Home  Is patient on multiple antipsychotic therapies at discharge:  No   Has Patient had three or more failed trials of antipsychotic monotherapy by history:  No  Recommended Plan for Multiple Antipsychotic Therapies: NA  Discharge Instructions    Diet - low sodium heart healthy   Complete by:  As directed    Increase activity slowly   Complete by:  As directed      Allergies as of 01/13/2018      Reactions   Amoxicillin    Penicillins Hives   Has patient had a PCN reaction causing immediate rash, facial/tongue/throat swelling, SOB or lightheadedness with hypotension: Yes Has patient had a PCN reaction causing severe rash involving mucus membranes or skin necrosis: No Has patient had a PCN reaction that required hospitalization: No Has patient had a PCN reaction occurring within the last 10 years: No If all of the above answers are "NO", then may proceed with Cephalosporin use.   Sudafed [pseudoephedrine Hcl]       Medication List    STOP taking these medications   risperiDONE 1 MG tablet Commonly known as:  RISPERDAL     TAKE these medications     Indication  paliperidone 234 MG/1.5ML Susp injection Commonly known as:  INVEGA SUSTENNA Inject 234 mg into the muscle every 28 (twenty-eight) days. Start taking on:  02/02/2018  Indication:  Schizoaffective Disorder      Follow-up Information    [place] Follow up.   Why:  Sherrian Divers will be picking you up on Monday, January 16, 2018 at 7:15 AM.  Please contact Mr. Marshell Levan over the weekend at [phone] to confirm  this appointment. Your medication management appointment is scheduled for 01/18/18 at 12:30PM. Contact information: [place] [address] [phone]           Follow-up recommendations:  Activity:  as tolerated Diet:  low sodium heart healthy Other:  keep follow up appointments  Comments:     Signed: Orson Slick, MD 01/13/2018, 3:35 PM

## 2018-01-13 NOTE — Progress Notes (Signed)
D- Patient alert and oriented. Patient presents in a pleasant mood on assessment reporting that she slept good last night. Patient denies any signs/symptoms of depression/anxiety. Patient also denies SI, HI, AVH, and pain at this time. Patient's goal for today is "to work on discharge today", which she will accomplish by "pay attention to the support staff". Patient reports on her self-inventory that she would like to "thank support staff".  A- Scheduled medications administered to patient, per MD orders. Support and encouragement provided.  Routine safety checks conducted every 15 minutes.  Patient informed to notify staff with problems or concerns.  R- No adverse drug reactions noted. Patient contracts for safety at this time. Patient compliant with medications and treatment plan. Patient receptive, calm, and cooperative. Patient interacts well with others on the unit.  Patient remains safe at this time.

## 2018-02-14 ENCOUNTER — Encounter: Payer: Self-pay | Admitting: Physician Assistant

## 2018-03-16 ENCOUNTER — Encounter: Payer: Self-pay | Admitting: Physician Assistant

## 2018-03-17 ENCOUNTER — Telehealth: Payer: Self-pay

## 2018-03-17 NOTE — Telephone Encounter (Signed)
Pt needs an office visit.    Left message for her to call back and schedule an appointment.   Thanks,   -Mickel Baas

## 2018-03-17 NOTE — Telephone Encounter (Signed)
Pt needs to schedule an office visit to fill out a form for employment.  I left a message for her schedule.  Please make an appointment when she calls back.   Thanks,   -Mickel Baas

## 2018-03-21 NOTE — Telephone Encounter (Signed)
Spoke with patient and advised as directed below. Patient stated "right, I read that email" I would have to call and schedule another time" "thank you".  Thanks, -[name]

## 2018-03-22 NOTE — Telephone Encounter (Signed)
Noted, thanks. Will fill the form out if she makes an appointment.

## 2019-06-29 IMAGING — CT CT HEAD W/O CM
3 series · 16 of 46 positions shown, 19 images · non-contrast
Comparison: None.

CLINICAL DATA: Altered level of consciousness.

EXAM:
CT HEAD WITHOUT CONTRAST
TECHNIQUE: Contiguous axial images were obtained from the base of the skull
through the vertex without intravenous contrast.

[Series 2: head wo · axial · 0.40mm/px · z∈[-63,+57]mm · 10 of 29 slices shown, 13 images]
[im 3/29  brain]
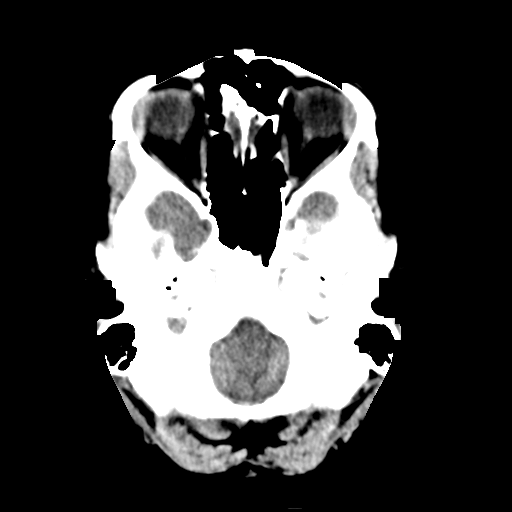
[im 3/29  bone]
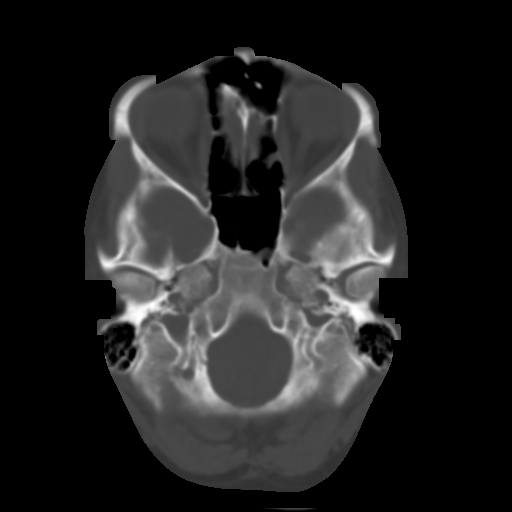
[im 6/29  brain]
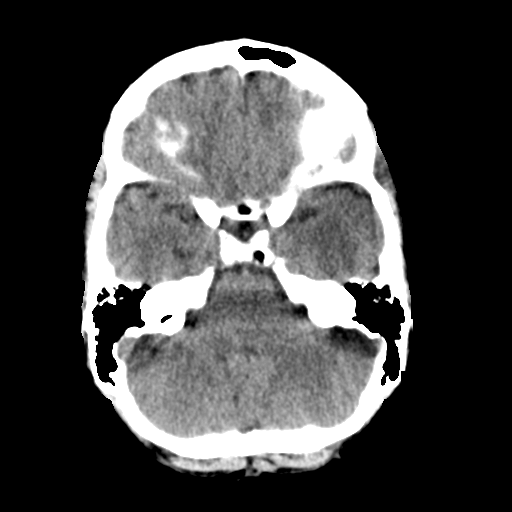
[im 8/29  brain]
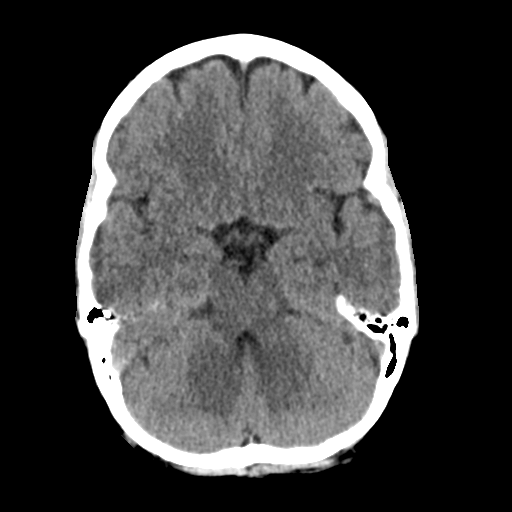
[im 11/29  brain]
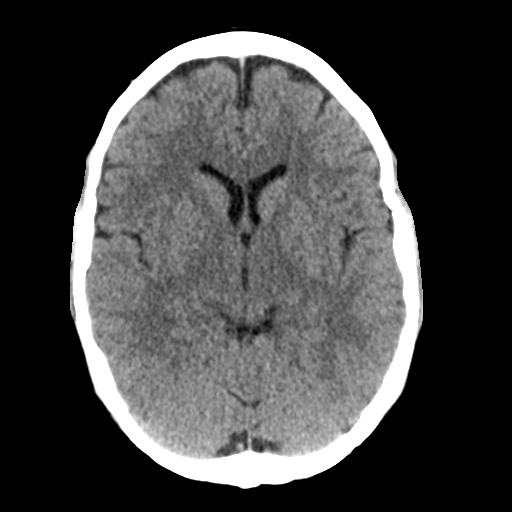
[im 14/29  brain]
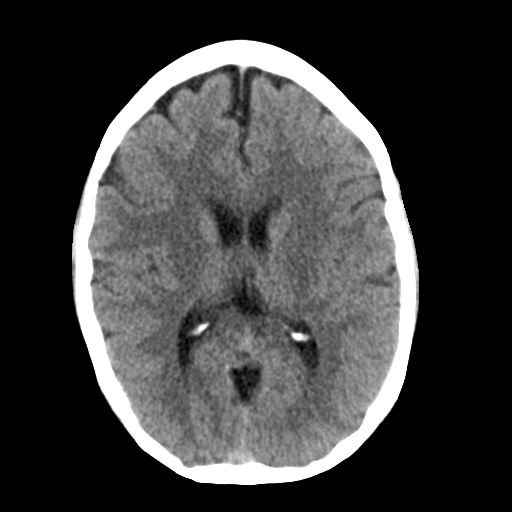
[im 14/29  bone]
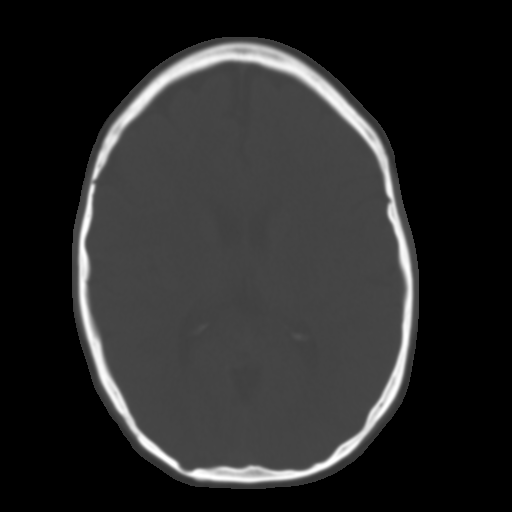
[im 16/29  brain]
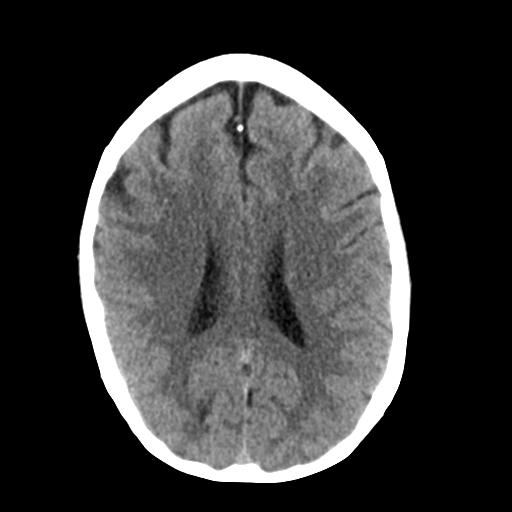
[im 19/29  brain]
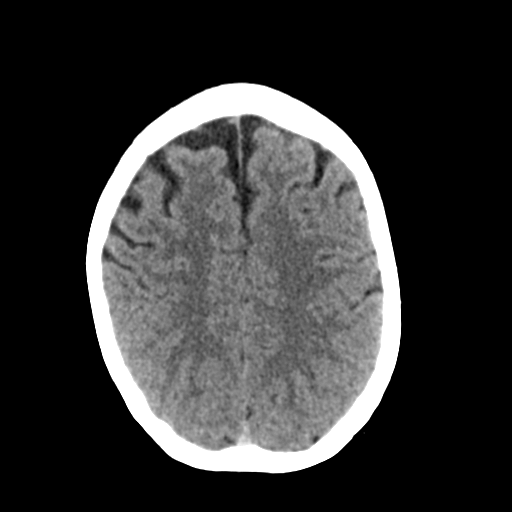
[im 22/29  brain]
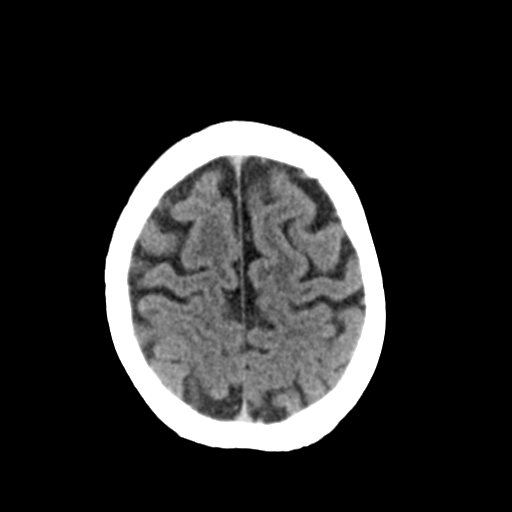
[im 24/29  brain]
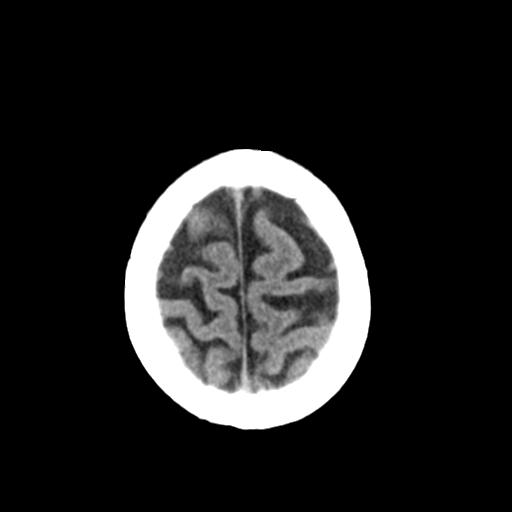
[im 24/29  bone]
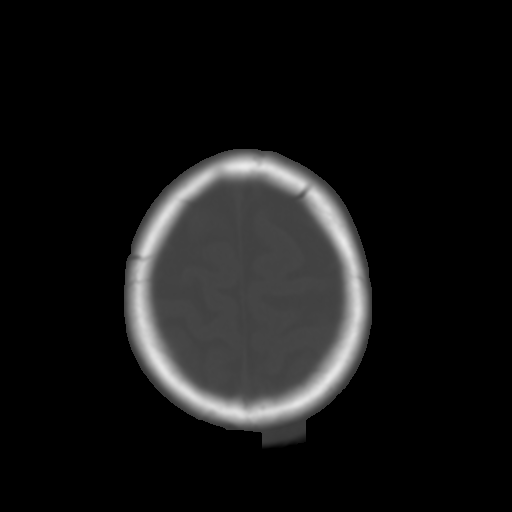
[im 27/29  brain]
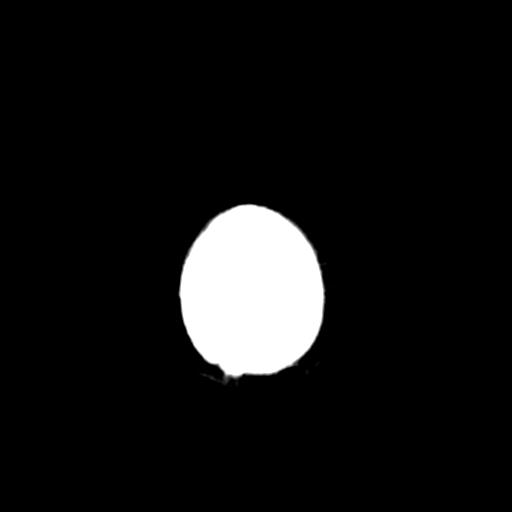

[Series 4: coronal soft tissue · coronal · 0.31mm/px · 3 of 66 slices shown]
[im 22/66  brain]
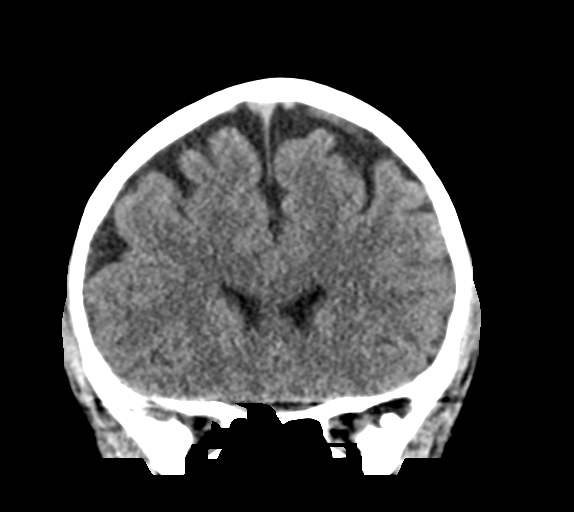
[im 29/66  brain]
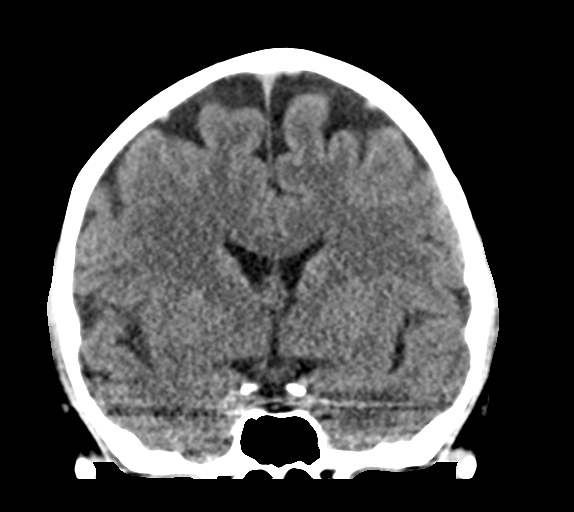
[im 37/66  brain]
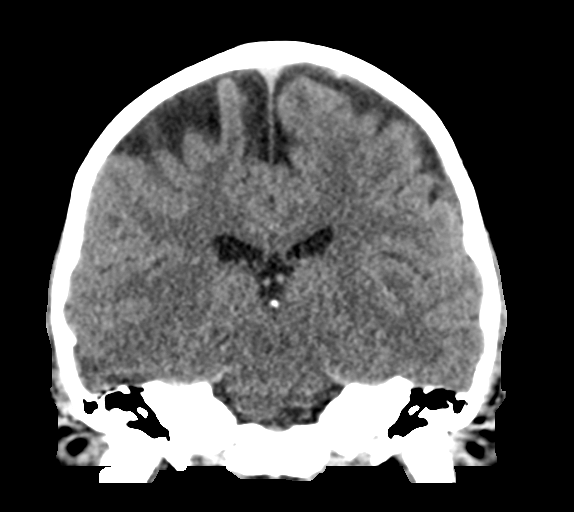

[Series 5: sagittal soft tissue · sagittal · 0.30mm/px · 3 of 54 slices shown]
[im 18/54  brain]
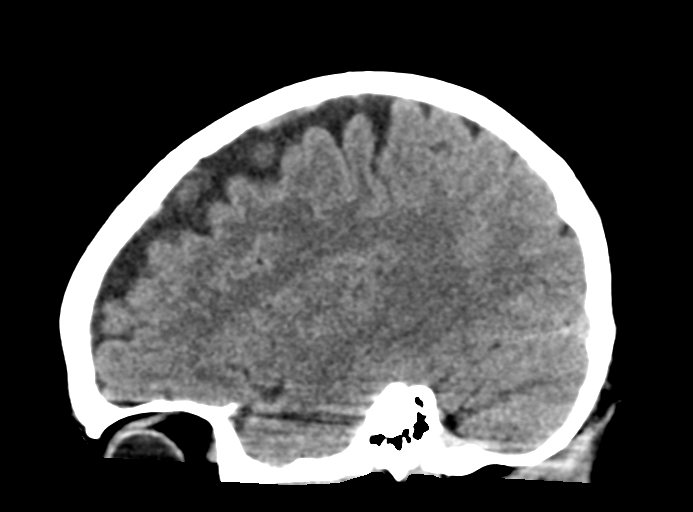
[im 27/54  brain]
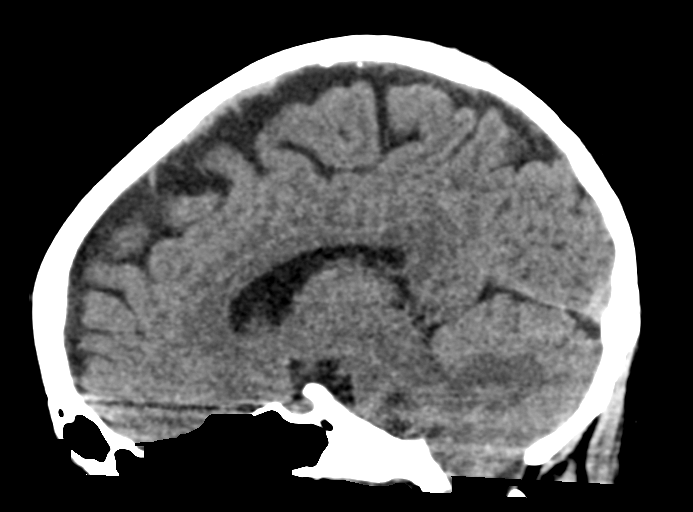
[im 36/54  brain]
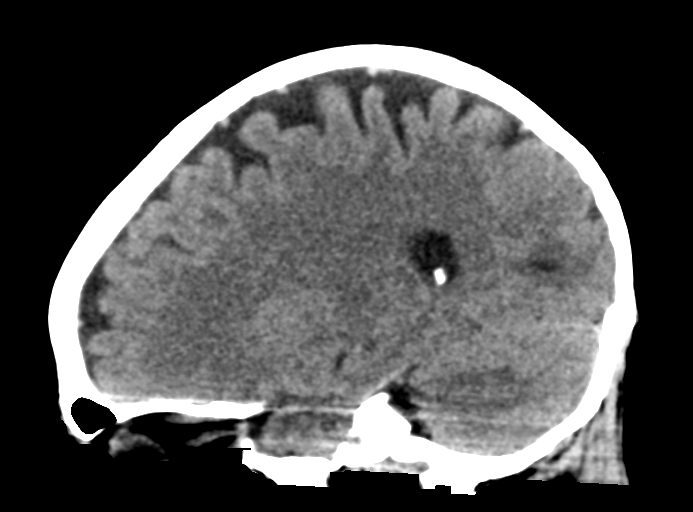

[16 of 46 positions shown; findings below may reference images not displayed]

FINDINGS: Brain: Ventricle size normal. Negative for hemorrhage, mass, or
infarction.

Vascular: Negative for hyperdense vessel

Skull: Negative

Sinuses/Orbits: Negative

Other: None
IMPRESSION: Negative CT head
